# Patient Record
Sex: Female | Born: 1955 | Race: White | Hispanic: No | Smoking: Former smoker
Health system: Southern US, Community
[De-identification: ages and names within clinical notes are randomized; demographics above are authoritative.]

## PROBLEM LIST (undated history)

## (undated) DIAGNOSIS — Z87442 Personal history of urinary calculi: Secondary | ICD-10-CM

## (undated) DIAGNOSIS — M1990 Unspecified osteoarthritis, unspecified site: Secondary | ICD-10-CM

## (undated) DIAGNOSIS — C801 Malignant (primary) neoplasm, unspecified: Secondary | ICD-10-CM

## (undated) DIAGNOSIS — N189 Chronic kidney disease, unspecified: Secondary | ICD-10-CM

## (undated) DIAGNOSIS — T4145XA Adverse effect of unspecified anesthetic, initial encounter: Secondary | ICD-10-CM

## (undated) DIAGNOSIS — Z8719 Personal history of other diseases of the digestive system: Secondary | ICD-10-CM

## (undated) DIAGNOSIS — J189 Pneumonia, unspecified organism: Secondary | ICD-10-CM

## (undated) DIAGNOSIS — S76111A Strain of right quadriceps muscle, fascia and tendon, initial encounter: Secondary | ICD-10-CM

## (undated) DIAGNOSIS — K219 Gastro-esophageal reflux disease without esophagitis: Secondary | ICD-10-CM

## (undated) DIAGNOSIS — R197 Diarrhea, unspecified: Secondary | ICD-10-CM

## (undated) DIAGNOSIS — T8859XA Other complications of anesthesia, initial encounter: Secondary | ICD-10-CM

## (undated) DIAGNOSIS — R112 Nausea with vomiting, unspecified: Secondary | ICD-10-CM

## (undated) DIAGNOSIS — K828 Other specified diseases of gallbladder: Secondary | ICD-10-CM

## (undated) DIAGNOSIS — M4317 Spondylolisthesis, lumbosacral region: Secondary | ICD-10-CM

## (undated) DIAGNOSIS — D134 Benign neoplasm of liver: Secondary | ICD-10-CM

## (undated) DIAGNOSIS — Z9889 Other specified postprocedural states: Secondary | ICD-10-CM

## (undated) HISTORY — DX: Diarrhea, unspecified: R19.7

## (undated) HISTORY — DX: Unspecified osteoarthritis, unspecified site: M19.90

## (undated) HISTORY — PX: ABDOMINAL HYSTERECTOMY: SHX81

## (undated) HISTORY — DX: Gastro-esophageal reflux disease without esophagitis: K21.9

## (undated) HISTORY — DX: Chronic kidney disease, unspecified: N18.9

## (undated) HISTORY — PX: VEIN LIGATION AND STRIPPING: SHX2653

## (undated) HISTORY — PX: TUBAL LIGATION: SHX77

---

## 1998-05-15 ENCOUNTER — Other Ambulatory Visit: Admission: RE | Admit: 1998-05-15 | Discharge: 1998-05-15 | Payer: Self-pay | Admitting: Obstetrics and Gynecology

## 1999-09-01 ENCOUNTER — Other Ambulatory Visit: Admission: RE | Admit: 1999-09-01 | Discharge: 1999-09-01 | Payer: Self-pay | Admitting: Obstetrics & Gynecology

## 1999-10-13 ENCOUNTER — Other Ambulatory Visit: Admission: RE | Admit: 1999-10-13 | Discharge: 1999-10-13 | Payer: Self-pay | Admitting: Anesthesiology

## 1999-11-23 ENCOUNTER — Encounter (INDEPENDENT_AMBULATORY_CARE_PROVIDER_SITE_OTHER): Payer: Self-pay | Admitting: Specialist

## 1999-11-23 ENCOUNTER — Ambulatory Visit (HOSPITAL_COMMUNITY): Admission: RE | Admit: 1999-11-23 | Discharge: 1999-11-23 | Payer: Self-pay | Admitting: General Surgery

## 2001-12-05 ENCOUNTER — Other Ambulatory Visit: Admission: RE | Admit: 2001-12-05 | Discharge: 2001-12-05 | Payer: Self-pay | Admitting: Obstetrics & Gynecology

## 2003-01-03 ENCOUNTER — Other Ambulatory Visit: Admission: RE | Admit: 2003-01-03 | Discharge: 2003-01-03 | Payer: Self-pay | Admitting: Obstetrics & Gynecology

## 2003-06-03 ENCOUNTER — Encounter (INDEPENDENT_AMBULATORY_CARE_PROVIDER_SITE_OTHER): Payer: Self-pay | Admitting: Specialist

## 2003-06-03 ENCOUNTER — Ambulatory Visit (HOSPITAL_COMMUNITY): Admission: RE | Admit: 2003-06-03 | Discharge: 2003-06-03 | Payer: Self-pay | Admitting: Vascular Surgery

## 2004-01-06 ENCOUNTER — Ambulatory Visit (HOSPITAL_COMMUNITY): Admission: RE | Admit: 2004-01-06 | Discharge: 2004-01-06 | Payer: Self-pay | Admitting: Obstetrics and Gynecology

## 2004-01-24 ENCOUNTER — Emergency Department (HOSPITAL_COMMUNITY): Admission: EM | Admit: 2004-01-24 | Discharge: 2004-01-24 | Payer: Self-pay | Admitting: Emergency Medicine

## 2004-02-21 ENCOUNTER — Other Ambulatory Visit: Admission: RE | Admit: 2004-02-21 | Discharge: 2004-02-21 | Payer: Self-pay | Admitting: Obstetrics & Gynecology

## 2005-05-04 ENCOUNTER — Other Ambulatory Visit: Admission: RE | Admit: 2005-05-04 | Discharge: 2005-05-04 | Payer: Self-pay | Admitting: Obstetrics & Gynecology

## 2005-10-05 ENCOUNTER — Ambulatory Visit (HOSPITAL_COMMUNITY): Admission: RE | Admit: 2005-10-05 | Discharge: 2005-10-05 | Payer: Self-pay | Admitting: Obstetrics & Gynecology

## 2006-04-11 ENCOUNTER — Encounter (INDEPENDENT_AMBULATORY_CARE_PROVIDER_SITE_OTHER): Payer: Self-pay | Admitting: *Deleted

## 2006-04-11 ENCOUNTER — Ambulatory Visit (HOSPITAL_COMMUNITY): Admission: RE | Admit: 2006-04-11 | Discharge: 2006-04-12 | Payer: Self-pay | Admitting: Obstetrics & Gynecology

## 2009-04-21 ENCOUNTER — Encounter: Admission: RE | Admit: 2009-04-21 | Discharge: 2009-04-21 | Payer: Self-pay | Admitting: Otolaryngology

## 2009-06-08 ENCOUNTER — Encounter
Admission: RE | Admit: 2009-06-08 | Discharge: 2009-06-08 | Payer: Self-pay | Admitting: Physical Medicine and Rehabilitation

## 2010-10-27 ENCOUNTER — Encounter (INDEPENDENT_AMBULATORY_CARE_PROVIDER_SITE_OTHER): Payer: 59

## 2010-10-27 ENCOUNTER — Ambulatory Visit (INDEPENDENT_AMBULATORY_CARE_PROVIDER_SITE_OTHER): Payer: 59 | Admitting: Vascular Surgery

## 2010-10-27 DIAGNOSIS — I83893 Varicose veins of bilateral lower extremities with other complications: Secondary | ICD-10-CM

## 2010-10-28 NOTE — Consult Note (Signed)
NEW PATIENT CONSULTATION  [name], [name] DOB:  Dec 05, 1955                                       10/27/2010 EAVWU#:[id number]  Patient presents today for evaluation of left leg pain.  She is known to me from a prior ligation stripping of her left great saphenous vein from the level of her knee to her saphenofemoral junction.  She is very active as a physical therapist and reports discomfort over her right calf with an area that she has had developed over her medial calf, tender to touch approximately 1 month ago.  This area is firm, and she reported discomfort with stretching her calf.  This has mostly resolved. She did not notice any erythema of this but was told by a coworker that there was some warmth over this area.  She does not have any history of prior DVT.  She does report some swelling in her left versus her right calf.  She does elevate her legs when possible.  PAST HISTORY:  Negative for major medical difficulties.  No hypertension, cardiac disease.  PAST SURGICAL HISTORY:  Saphenous vein removal and hysterectomy.  SOCIAL HISTORY:  She is single with 3 children.  She quit smoking 25 years ago.  She does not drink alcohol.  She works as a Adult nurse.  REVIEW OF SYSTEMS:  Reported weight gain, up to 165 pounds.  She is 5 feet 5 inches tall. VASCULAR:  Positive for pain with walking. CARDIAC:  Negative. GI:  Positive for hiatal hernia. PULMONARY:  Asthma. URINARY:  Kidney disease with calcium uremic disease. The remainder of her review of systems is entirely normal.  PHYSICAL EXAMINATION:  A well-developed and well-nourished white female appearing her stated age in no acute distress.  Blood pressure is 112/78, pulse 72, respirations 16.  HEENT:  Normal.  She has 2+ radial and 2+ dorsalis pedis pulses bilaterally.  She does have some scattered telangiectasia on her left leg with no evidence of varicosities.  She does have some swelling in her  left calf versus her right.  The area of concern over her medial calf is by physical exam an area of superficial thrombophlebitis in a small plexus of veins at this level.  She underwent noninvasive vascular laboratory studies  in our office, and this reveals no evidence of DVT.  No evidence of deep or superficial reflux.  Her left great saphenous vein is not visualized in her thigh.  I discussed the significance of all this with patient.  I do not see any evidence of serious venous pathology when I imaged the area of concern with the SonoSite Ultrasound.  This clearly is an area of superficial thrombophlebitis, which is resolving.  She was reassured with this discussion.  I explained that this will continue to resolve spontaneously without treatment, and we will see her again on an as- needed basis.    Larina Earthly, M.D. Electronically Signed  TFE/MEDQ  D:  10/27/2010  T:  10/28/2010  Job:  [id number]  cc:   Gerrit Friends. Aldona Bar, M.D.

## 2010-11-10 NOTE — Procedures (Unsigned)
LOWER EXTREMITY VENOUS REFLUX EXAM  INDICATION:  Varicose veins.  EXAM:  Using color-flow imaging and pulse Doppler spectral analysis, the left common femoral, superficial femoral, popliteal, posterior tibial, greater and lesser saphenous veins were evaluated.  There is no evidence suggesting deep venous insufficiency in the left lower extremity.  The left saphenofemoral junction is competent. The left GSV is competent.  The left proximal short saphenous vein demonstrates competency.  GSV Diameter (used if found to be incompetent only)                                           Right    Left Proximal Greater Saphenous Vein           cm       cm Proximal-to-mid-thigh                     cm       cm Mid thigh                                 cm       cm Mid-distal thigh                          cm       cm Distal thigh                              cm       cm Knee                                      cm       cm  IMPRESSION: 1. Left greater saphenous vein is competent. 2. The left greater saphenous vein is not tortuous. 3. The deep venous system is competent. 4. The left small saphenous vein is competent.  ___________________________________________ Larina Earthly, M.D.  OD/MEDQ  D:  10/27/2010  T:  10/27/2010  Job:  [id number]

## 2010-12-04 NOTE — Op Note (Signed)
NAMETESSIE, ORDAZ NO.:  [id number]   MEDICAL RECORD NO.:  [id number]          PATIENT TYPE:  AMB   LOCATION:  SDC                           FACILITY:  [facility]   PHYSICIAN:  Gerrit Friends. Aldona Bar, M.D.   DATE OF BIRTH:  01-21-1956   DATE OF PROCEDURE:  04/11/2006  DATE OF DISCHARGE:                                 OPERATIVE REPORT   PREOPERATIVE DIAGNOSIS:  Dysmenorrhea and menorrhagia, retroverted uterus,  suspected adenomyosis.   POSTOPERATIVE DIAGNOSIS:  Dysmenorrhea and menorrhagia, retroverted uterus,  suspected adenomyosis, pathology pending.   PROCEDURE:  Total vaginal hysterectomy.   SURGEON:  Gerrit Friends. Aldona Bar, M.D.   ASSISTANT:  Luvenia Redden, M.D.   ANESTHESIA:  General endotracheal.   PROCEDURE:  The patient was taken to the operating where, after the  satisfactory induction of general endotracheal anesthesia, was prepped and  draped and placed in the Allen stirrups in the lithotomy position.  As part  of the prep, the bladder was drained of clear urine with a red rubber  catheter in an in-and-out fashion.   Once the patient was adequately draped, the procedure was begun.  A  posterior weighted speculum was placed in the vagina and a double tooth  tenaculum placed on the anterior and posterior lip of the cervix.  There was  good descensus of the cervix to the introitus.  The cervix, at this time,  was circumferentially injected with a dilute solution of normal saline and  Neo-Synephrine and, thereafter, a circumferential incision was made about  the cervix and the cervix dissected off the vagina.  The posterior  peritoneum was identified and entered appropriately.  The posterior surface  of the uterus felt somewhat smooth, the uterus was noted to be at least  upper limits of normal size and was boggy in its field.  At this time, the  uterosacral pedicles were taken bilaterally using curved Heaney clamps and  suture ligature of 0 Vicryl suture.   Additional bites were taken, likewise,  in a similar fashion.  The anterior bladder flap was dissected off and  further parametrial bites were taken with care taken to avoid the bladder  anteriorly.  With further dissection and a fourth bite bilaterally, taken in  a similar fashion using curved Heaney clamps and suture ligatures of 0  Vicryl suture. the bladder flap was definitely identified, entered, and,  thereafter, the uterine artery pedicles were taken bilaterally in a similar  fashion as were additional parametrial pedicles up to the point where the  uterus could be safely inverted.  The ovarian pedicles were clamped, cut and  doubly suture secured.  The specimen was removed and sent to pathology.  The  ovaries appeared normal.  At this time, the posterior peritoneum and  uterosacral was rendered hemostatic with a suture of 0 Vicryl placed in a  locking fashion.  The bladder was again drained of clear urine with a rubber  catheter. At this time, all pedicles were noted to be dry.  A pursestring  suture of 0 Vicryl was used to close  the peritoneum, extraperitonealizing  all vascular pedicles with the exception of the ovarian pedicles which were  cut free after they were noted to be hemostatic.  The vaginal cuff was then  reapproximated in a vertical fashion using interrupted 0 Vicryl suture.  At  this time,  the procedure was felt to be complete and was terminated.  The  cuff was dry.  All counts were correct.  The patient was hemodynamically  stable and was taken to the recovery room in satisfactory addition.   ESTIMATED BLOOD LOSS:  100 mL.   COUNTS:  All counts correct x2.   PATHOLOGIC SPECIMEN:  Consisted of the uterus.  The tubes and ovaries  appeared normal.      Gerrit Friends. Aldona Bar, M.D.  Electronically Signed     RMW/MEDQ  D:  04/11/2006  T:  04/12/2006  Job:  [id number]

## 2010-12-04 NOTE — Op Note (Signed)
NAME:  [name], [name] NO.:  [id number]   MEDICAL RECORD NO.:  [id number]                   PATIENT TYPE:  OIB   LOCATION:  2896                                 FACILITY:  [facility]   PHYSICIAN:  Larina Earthly, M.D.                 DATE OF BIRTH:  14-May-1956   DATE OF PROCEDURE:  06/03/2003  DATE OF DISCHARGE:                                 OPERATIVE REPORT   PREOPERATIVE DIAGNOSIS:  Greater saphenous incompetence and tributary  varicosities, left leg.   POSTOPERATIVE DIAGNOSIS:  Greater saphenous incompetence and tributary  varicosities, left leg.   OPERATION PERFORMED:  Ligation and stripping greater saphenous vein from the  groin to the knee and removal of tributary varicosities at the calf region  with stab avulsion.   SURGEON:  Larina Earthly, M.D.   ASSISTANT:  Jerold Coombe, P.A.   ANESTHESIA:  General endotracheal.   COMPLICATIONS:  None.   DISPOSITION:  Recovery room stable.   DESCRIPTION OF PROCEDURE:  The patient was taken to the operating room and  placed in supine position where the area of the left groin and left leg were  prepped and draped in the usual sterile fashion.  Incision was made over the  level of the saphenofemoral junction and was carried down to isolate the  tributary branches of the saphenofemoral junction and the saphenous vein.  The tributary branches were removed with avulsion and the saphenous vein was  doubly ligated at the junction with the common femoral vein.  The vein was  encircled with a 2-0 silk Potts tie distally and then incision was made  transversely in the saphenous vein and a stripper was passed retrograde down  to the level of the knee.  Small incision was made at the site at the level  of the knee and the saphenous vein was ligated distally at this point and  the stripper was brought out through the saphenous vein at this point.  Next  using multiple stab incisions, the tributary  varicosities were removed with  the stab avulsion technique.  Pressure was held for hemostasis.  The  saphenous vein was then stripped throughout the thigh from the groin to the  knee.  Again pressure was held for hemostasis.  The wounds in the calf were  closed with subcuticular 4-0 Vicryl suture.  Next, the groins were closed  with 3-0 Vicryl in the subcutaneous and subcuticular tissue.  Benzoin and  Steri-Strips were applied.  A Kerlix and Coban pressure dressing were  applied. The patient was extubated and taken to the recovery room in stable  condition.  Larina Earthly, M.D.    TFE/MEDQ  D:  06/03/2003  T:  06/03/2003  Job:  [id number]

## 2010-12-04 NOTE — Discharge Summary (Signed)
NAMENAIOMI, MUSTO NO.:  [id number]   MEDICAL RECORD NO.:  [id number]          PATIENT TYPE:  OIB   LOCATION:  9310                          FACILITY:  [facility]   PHYSICIAN:  Gerrit Friends. Aldona Bar, M.D.   DATE OF BIRTH:  [birth year]   DATE OF ADMISSION:  04/11/2006  DATE OF DISCHARGE:  04/12/2006                                 DISCHARGE SUMMARY   DISCHARGE DIAGNOSES:  1. Adenomyosis of the uterus.  2. Leiomyomatous uterus.  3. Dysmenorrhea.  4. Menorrhagia.   PROCEDURE:  Total vaginal hysterectomy.   SUMMARY:  This 55 year old patient who presented with dysmenorrhea,  menorrhagia, dyspareunia and enlarged posterior uterus was taken to the  operating room after a normal preoperative workup for a total vaginal  hysterectomy which was carried out without incident. Her postoperative  course was unremarkable. Her pathology revealed adenomyosis and leiomyomas.  The uterus weighed slightly over 200 g. During her postoperative course, she  remained afebrile. Vital signs remained stable. On the morning of September  25, her hemoglobin was 10.8 with a white count of 8300 and platelet count of  244,000. On the afternoon of September 25, she was ambulating without  difficulty, tolerating p.o. analgesia without difficulty. Her vital signs  remained stable. She remained afebrile. She was tolerating a regular diet  well. She was having normal bladder function and was passing flatus. She was  discharged to home with all appropriate instructions. She will return to the  office for followup in approximately four weeks' time.   DISCHARGE MEDICATIONS:  1. Motrin 600 mg every 6 hours as needed for cramping or pain.  2. Darvocet-N 100 one every 4 hours as needed for mild pain.  3. Dilaudid 2 mg 1 every 4 hours as needed for more severe pain.   As mentioned, she was given all detailed instructions at the time of  discharge and understood all instructions well.   CONDITION ON DISCHARGE:   Improved.      Gerrit Friends. Aldona Bar, M.D.  Electronically Signed     RMW/MEDQ  D:  04/12/2006  T:  04/14/2006  Job:  [id number]

## 2010-12-04 NOTE — Op Note (Signed)
[facility]  Patient:    [name], [name]                          MRN: [id number] Proc. Date: 11/23/99 Adm. Date:  [id number] Disc. Date: [id number] Attending:  Arlis Porta                           Operative Report  PREOPERATIVE DIAGNOSIS:       Hemorrhoids.  POSTOPERATIVE DIAGNOSES:      Hemorrhoids, anal fissure.  PROCEDURE:                    1. Lateral internal sphincterotomy and                                  fissurectomy.                               2. Hemorrhoidectomy.  SURGEON:                      Adolph Pollack, M.D.  INDICATIONS:  This is a 55 year old female who has been suffering from hemorrhoids for a long time.  She had a thrombosed hemorrhoid excised.  She continues to have problems with her hemorrhoids and also complains of burning pain after a bowel movement and when sitting.  She is unable to be examined in the office because she was severely uncomfortable.  She is now brought to the operating room.  The procedure and the risks including, but not limited to, bleeding, infection, anal stenosis, and anal incontinence, were explained to her.  DESCRIPTION OF PROCEDURE:  She was placed supine on the operating table and general anesthetic was administered.  She was then placed in the lithotomy position.  The perianal area was sterilely prepped and draped.  Examination under anesthesia was performed.  There was a left lateral hemorrhoid present and a right anterior lateral hemorrhoid present.  There was no evidence of right posterolateral hemorrhoidal disease.  There was a posterior anal fissure present.  I subsequently performed a closed lateral internal sphincterotomy by making an incision in the 3 oclock position and tenting up the internal sphincter muscle and after incising that, I dilated the anus.  I then performed fissurectomy using the cautery and sent it to pathology.  I approached the left-sided hemorrhoid.  I  marked the excision area with the cautery, then injected 0.5% Marcaine with epinephrine and excised it sharply. Bleeding points were controlled with the cautery.  I closed the mucosa with running locking 3-0 chromic suture.  Next, I approached the right anterior lateral hemorrhoid and marked its area of excision with the cautery.  I then injected local anesthetic and excised it sharply.  I then closed the mucosa with a running locking 3-0 chromic suture.  I noticed some bleeding internally at the rectal area, and I just ligated this with a 3-0 chromic suture.  I then inspected the area and noted no further bleeding.  An anal block with 0.5% Marcaine with epinephrine was performed.  I then placed a piece of Gelfoam in the anal canal and covered it with a bulky dressing.  She tolerated the procedure well without any apparent complications and was taken to the recovery room in satisfactory  condition.DD:  11/25/99 TD:  11/25/99 Job: [id number]

## 2010-12-04 NOTE — H&P (Signed)
[name], [name] NO.:  [id number]   MEDICAL RECORD NO.:  [id number]          PATIENT TYPE:  AMB   LOCATION:  SDC                           FACILITY:  [facility]   PHYSICIAN:  Gerrit Friends. Aldona Bar, M.D.   DATE OF BIRTH:  [birth year]   DATE OF ADMISSION:  04/11/2006  DATE OF DISCHARGE:                                HISTORY & PHYSICAL   OFFICE NUMBER:  [id number].   This patient is being admitted on Monday, September 24.  She is a 7:30 case.  Please have this dictation in the day surgery area.   HISTORY:  [name] is a 55 year old gravida 3, para 3, married female being taken  to the operating room for a total vaginal hysterectomy with a preoperative  diagnosis of back pain, likely adenomyosis, dysmenorrhea, and dyspareunia.   This patient has had a known retroflexed uterus for years and in the past 6  months, there have been increasing symptoms of lower back discomfort,  discomfort with intercourse, cramping with her periods, more irregular  bleeding, and increased pelvic pressure in general.  Her evaluation has  included an ultrasound, which was done in March 2007, which revealed the  retroflexed uterus with an overall globular appearance and some ill  definition of the zonal anatomy consistent with probable adenomyosis.  Both  ovaries appeared normal.  The patient's cytology has always been normal and  her mammograms have always been normal.  More recently there have been some  problems with irregular bleeding and dysmenorrhea, and this was controlled  most recently with some oral contraceptives.   Because of the increased pelvic pressure and increased discomfort related to  the retroverted uterus, she is now being taken to the operating room for a  total vaginal hysterectomy.   She does have a history of kidney stones.  Her most recent visit to the  urologist was in June 2007.   She did have a tubal sterilization procedure, which was carried out  postpartum after the  birth of her last child.  She has also had a saphenous  vein in her left leg removed by Dr. Arbie Cookey about 5 years ago.   She does not relate any significant bladder issues such as stress  incontinence, although she says she does have some problem occasionally  holding her urine, especially with continued strenuous exercise.   After multiple discussions with the patient, it was decided to proceed with  a total vaginal hysterectomy as the simplest way of alleviating her symptoms  of pelvic pressure, low back pain, dysmenorrhea, dyspareunia and irregular  bleeding.   PAST MEDICAL HISTORY:  Allergies:  The patient is sensitive to PENICILLIN,  ERYTHROMYCIN and MORPHINE.   Medications at this time include hydrochlorothiazide 12.5-25 mg in the  morning (prescribed by Dr. Aldean Ast).  She is also on some Advair 150/50  mcg at bedtime.  She will bring this inhaler to the hospital with her.   Operative procedures:  As above.   FAMILY HISTORY:  Noncontributory.   SOCIAL HISTORY:  Noncontributory.   REVIEW OF SYSTEMS:  Negative  with the exception of above.   PHYSICAL EXAMINATION:  GENERAL:  Examination at the time of admission finds  a well-developed female.  VITAL SIGNS:  Weight 159.  Blood pressure 128/70.  Height 5 feet 5 inches.  Temperature 98.2.  Pulse 80 and regular.  Respirations 18 and regular.  HEENT:  Negative.  Thyroid not enlarged.  CHEST:  Clear to auscultation and percussion.  CARDIOVASCULAR:  Regular rate and rhythm, no murmur.  BREASTS:  Negative.  ABDOMEN:  Unremarkable.  PELVIC:  External genitalia, BUS normal.  Introitus bidigital, vault fairly  well-supported.  Cervix:  No gross lesions.  The cervix does pull down to  the introitus.  The uterus is very posterior and tender with manipulation.  Adnexal area is unremarkable.  Rectovaginal exam confirmatory.  EXTREMITIES:  Negative.  NEUROLOGIC:  Physiologic.   IMPRESSION:  1. Dyspareunia.  2. Dysmenorrhea.  3. Low  back pain.  4. Suspected adenomyosis.  5. Retroverted uterus.   PLAN:  Total vaginal hysterectomy.      Gerrit Friends. Aldona Bar, M.D.  Electronically Signed     RMW/MEDQ  D:  04/07/2006  T:  04/07/2006  Job:  [id number]

## 2011-06-17 ENCOUNTER — Other Ambulatory Visit: Payer: Self-pay | Admitting: Gastroenterology

## 2011-06-17 DIAGNOSIS — R1013 Epigastric pain: Secondary | ICD-10-CM

## 2011-06-18 ENCOUNTER — Ambulatory Visit
Admission: RE | Admit: 2011-06-18 | Discharge: 2011-06-18 | Disposition: A | Discharge status: Home / Self Care | Payer: 59 | Source: Ambulatory Visit | Attending: Gastroenterology | Admitting: Gastroenterology

## 2011-06-18 DIAGNOSIS — R1013 Epigastric pain: Secondary | ICD-10-CM

## 2011-07-23 ENCOUNTER — Other Ambulatory Visit: Payer: Self-pay | Admitting: Gastroenterology

## 2011-07-23 ENCOUNTER — Other Ambulatory Visit (HOSPITAL_COMMUNITY): Payer: Self-pay | Admitting: Gastroenterology

## 2011-07-30 ENCOUNTER — Ambulatory Visit
Admission: RE | Admit: 2011-07-30 | Discharge: 2011-07-30 | Disposition: A | Discharge status: Home / Self Care | Payer: 59 | Source: Ambulatory Visit | Attending: Gastroenterology | Admitting: Gastroenterology

## 2011-07-30 MED ORDER — IOHEXOL 300 MG/ML  SOLN
100.0000 mL | Freq: Once | INTRAMUSCULAR | Status: AC | PRN
  Administered 2011-07-30: 100 mL via INTRAVENOUS

## 2011-08-13 ENCOUNTER — Inpatient Hospital Stay (HOSPITAL_COMMUNITY)
Admission: RE | Admit: 2011-08-13 | Discharge: 2011-08-13 | Payer: 59 | Source: Ambulatory Visit | Attending: Gastroenterology | Admitting: Gastroenterology

## 2011-08-19 ENCOUNTER — Encounter (HOSPITAL_COMMUNITY)
Admission: RE | Admit: 2011-08-19 | Discharge: 2011-08-19 | Disposition: A | Discharge status: Home / Self Care | Payer: 59 | Source: Ambulatory Visit | Attending: Gastroenterology | Admitting: Gastroenterology

## 2011-08-19 DIAGNOSIS — R109 Unspecified abdominal pain: Secondary | ICD-10-CM | POA: Insufficient documentation

## 2011-08-19 MED ORDER — TECHNETIUM TC 99M MEBROFENIN IV KIT
5.5000 | PACK | Freq: Once | INTRAVENOUS | Status: AC | PRN
  Administered 2011-08-19: 6 via INTRAVENOUS

## 2011-09-08 ENCOUNTER — Ambulatory Visit (INDEPENDENT_AMBULATORY_CARE_PROVIDER_SITE_OTHER): Payer: 59 | Admitting: General Surgery

## 2011-09-08 ENCOUNTER — Encounter (INDEPENDENT_AMBULATORY_CARE_PROVIDER_SITE_OTHER): Payer: Self-pay | Admitting: General Surgery

## 2011-09-08 VITALS — BP 126/82 | HR 70 | Temp 98.0°F | Ht 65.0 in | Wt 177.2 lb

## 2011-09-08 DIAGNOSIS — N200 Calculus of kidney: Secondary | ICD-10-CM

## 2011-09-08 DIAGNOSIS — K828 Other specified diseases of gallbladder: Secondary | ICD-10-CM | POA: Insufficient documentation

## 2011-09-08 NOTE — Progress Notes (Signed)
Patient ID: [name], female   DOB: [birth year], 56 y.o.   MRN: [id number]  Chief Complaint  Patient presents with  . Pre-op Exam    eval abn hida scan    HPI [name] is a 56 y.o. female.   HPI  She is referred by Dr. Bosie Clos for further evaluation and treatment of epigastric abdominal pain and gallbladder dysfunction-EF of 9%.  She developed postprandial pressure type epigastric pain about 6-8 weeks ago.  She occasionally has nausea with this.  She took an antacid and had some relief but is still having the pain now.  She does have alternating diarrhea and constipation at times.  An abdominal US demonstrated no gallstones or wall thickening.  A CT scan demonstrated kidney stones.  A nuclear medicine hepatobiliary scan demonstrated a gallbladder ejection fraction of 9 %.    Also, Dr. Annabell Howells had referred her to Dr. Gerrit Friends for evaluation of possible hyperparathyroidism.  She has multiple kidney stones and he was concerned that hyperPTH may be the etiology.  She has asked me to evaluate this as well.  Past Medical History  Diagnosis Date  . Arthritis   . Asthma   . GERD (gastroesophageal reflux disease)   . Chronic kidney disease   . Trouble swallowing   . Abdominal distension   . Abdominal pain   . Constipation   . Diarrhea   . Arthritis pain     History reviewed. No pertinent past surgical history.  Family History  Problem Relation Age of Onset  . Cancer Mother     colon  . Heart disease Father   . Cancer Sister     lung/squamous cell    Social History History  Substance Use Topics  . Smoking status: Former Games developer  . Smokeless tobacco: Former Neurosurgeon    Quit date: 09/07/1981  . Alcohol Use: 0.0 oz/week    1-2 Glasses of wine per week     wek    Allergies  Allergen Reactions  . Penicillins Anaphylaxis  . Morphine And Related     Low b/p.[name]...hallucinaions  . Erythromycin Hives and Rash    Current Outpatient Prescriptions  Medication Sig Dispense Refill    . Fluticasone-Salmeterol (ADVAIR) 100-50 MCG/DOSE AEPB Inhale 1 puff into the lungs every 12 (twelve) hours.      Marland Kitchen HYDROcodone-acetaminophen (NORCO) 5-325 MG per tablet       . pantoprazole (PROTONIX) 40 MG tablet         Review of Systems Review of Systems  Constitutional: Negative for fever and chills.  HENT: Positive for trouble swallowing.   Respiratory: Negative.   Cardiovascular: Negative.   Gastrointestinal: Positive for nausea, abdominal pain, diarrhea, constipation and abdominal distention.  Genitourinary: Negative for dysuria, hematuria and vaginal bleeding.  Musculoskeletal: Positive for arthralgias.  Neurological: Negative for syncope, light-headedness and headaches.  Hematological: Does not bruise/bleed easily.    Blood pressure 126/82, pulse 70, temperature 98 F (36.7 C), temperature source Temporal, height 5\' 5"  (1.651 m), weight 177 lb 3.2 oz (80.377 kg), last menstrual period 08/19/2011, SpO2 97.00%.  Physical Exam Physical Exam  Constitutional:       Overweight female in NAD  HENT:  Head: Normocephalic and atraumatic.  Eyes: Conjunctivae and EOM are normal.  Cardiovascular: Normal rate and regular rhythm.   No murmur heard. Pulmonary/Chest: She is in respiratory distress.  Abdominal: Soft. She exhibits no distension. There is no tenderness. There is no guarding.       Small  subumbilical scar.  Musculoskeletal: She exhibits no edema.  Neurological: She is alert.  Skin: Skin is warm and dry.  Psychiatric: She has a normal mood and affect. Her behavior is normal.    Data Reviewed Dr. Bosie Clos note.  [place] and HIDA reports.  Dr. Belva Crome notes which demonstrate a serium calcium level of 9.5 and a iPTH  Level of 57.9-both within normal limits.  Assessment    1.  Epigastric pain and biliary dyskinesia-she is interested in cholecystectomy for this.  2.  Kidney stones-doubt hyperparathyroidism.  I discussed this with Dr. Gerrit Friends and he felt that there was a low  likelihood for hyperparathyroidism, however a couple of other tests could be run.    Plan    24 hour urine for calcium.  25-hydroxy-vitamin D level.  Laparoscopic cholecystectomy with IOC.  I have explained the procedure, risks, and aftercare of cholecystectomy.  Risks include but are not limited to bleeding, infection, wound problems, anesthesia, diarrhea, bile leak, injury to common bile duct/liver/intestine.  Shee seems to understand and agrees to proceed.  With discuss the results of the above tests with her when the results are known.        [name] J 09/08/2011, 6:26 PM

## 2011-09-08 NOTE — Patient Instructions (Signed)
Strict lowfat diet. 

## 2011-09-20 ENCOUNTER — Encounter (INDEPENDENT_AMBULATORY_CARE_PROVIDER_SITE_OTHER): Payer: 59 | Admitting: Surgery

## 2011-09-20 ENCOUNTER — Other Ambulatory Visit: Payer: Self-pay | Admitting: Orthopedic Surgery

## 2011-09-20 DIAGNOSIS — M4800 Spinal stenosis, site unspecified: Secondary | ICD-10-CM

## 2011-09-20 DIAGNOSIS — M5126 Other intervertebral disc displacement, lumbar region: Secondary | ICD-10-CM

## 2011-09-23 ENCOUNTER — Ambulatory Visit (INDEPENDENT_AMBULATORY_CARE_PROVIDER_SITE_OTHER): Payer: 59 | Admitting: General Surgery

## 2011-09-24 ENCOUNTER — Ambulatory Visit
Admission: RE | Admit: 2011-09-24 | Discharge: 2011-09-24 | Disposition: A | Discharge status: Home / Self Care | Payer: 59 | Source: Ambulatory Visit | Attending: Orthopedic Surgery | Admitting: Orthopedic Surgery

## 2011-09-24 DIAGNOSIS — M5126 Other intervertebral disc displacement, lumbar region: Secondary | ICD-10-CM

## 2011-09-24 DIAGNOSIS — M4800 Spinal stenosis, site unspecified: Secondary | ICD-10-CM

## 2011-09-24 MED ORDER — IOHEXOL 180 MG/ML  SOLN
20.0000 mL | Freq: Once | INTRAMUSCULAR | Status: AC | PRN
  Administered 2011-09-24: 20 mL via INTRATHECAL

## 2011-09-24 MED ORDER — DIAZEPAM 5 MG PO TABS
10.0000 mg | ORAL_TABLET | Freq: Once | ORAL | Status: AC
  Administered 2011-09-24: 10 mg via ORAL

## 2011-09-24 NOTE — Progress Notes (Signed)
1425 Pt c/o some tingling in fingers and some tingling in chest. Skin warm and dry, bp 117/74 with heart rate 72. Pt up to get dressed and felt better after she got up. Stated she was really hungry and had a big gas bubble.

## 2011-09-26 ENCOUNTER — Telehealth (HOSPITAL_COMMUNITY): Payer: Self-pay | Admitting: Diagnostic Radiology

## 2011-09-26 NOTE — Telephone Encounter (Signed)
Pt. Called.  She has positional headache after myelogram.  She probably has a csf leak headache.  She does not have fever.  We spoke for about 10 minutes.  She is going to continue to lie flat till tomorrow morning.  i gave her the phone number of the nurse at [address] and told her to call that number in the morning if her headache persists.  She may need a blood patch.

## 2011-09-27 ENCOUNTER — Other Ambulatory Visit: Payer: Self-pay | Admitting: Orthopedic Surgery

## 2011-09-27 ENCOUNTER — Encounter (HOSPITAL_COMMUNITY): Payer: Self-pay | Admitting: Pharmacy Technician

## 2011-09-27 ENCOUNTER — Telehealth: Payer: Self-pay | Admitting: Radiology

## 2011-09-27 ENCOUNTER — Inpatient Hospital Stay
Admission: RE | Admit: 2011-09-27 | Discharge: 2011-09-27 | Payer: 59 | Source: Ambulatory Visit | Attending: Orthopedic Surgery | Admitting: Orthopedic Surgery

## 2011-09-27 DIAGNOSIS — G971 Other reaction to spinal and lumbar puncture: Secondary | ICD-10-CM

## 2011-09-27 NOTE — Pre-Procedure Instructions (Signed)
[address] KETSIA [address]BAUGH  09/27/2011   Your procedure is scheduled on:  10/08/2011 @12 :10PM  Report to [facility] at 5:30 AM.  Call this number if you have problems the morning of surgery: [phone]   Remember:   Do not eat food:After Midnight.  May have clear liquids: up to 4 Hours before arrival(nothing after 1:30AM).  Clear liquids include soda, tea, black coffee, apple or grape juice, broth.  Take these medicines the morning of surgery with A SIP OF WATER: Protonix, Gabapentin, Percocet   Do not wear jewelry, make-up or nail polish.  Do not wear lotions, powders, or perfumes. You may wear deodorant.  Do not shave 48 hours prior to surgery.  Do not bring valuables to the hospital.  Contacts, dentures or bridgework may not be worn into surgery.  Leave suitcase in the car. After surgery it may be brought to your room.  For patients admitted to the hospital, checkout time is 11:00 AM the day of discharge.   Patients discharged the day of surgery will not be allowed to drive home.  Name and phone number of your driver:   Special Instructions: CHG Shower Use Special Wash: 1/2 bottle night before surgery and 1/2 bottle morning of surgery.   Please read over the following fact sheets that you were given: Pain Booklet, Coughing and Deep Breathing and Surgical Site Infection Prevention

## 2011-09-27 NOTE — Telephone Encounter (Signed)
Pt had a myelo on 09/24/11, started with h/a on sat. Called our dr. at [facility] and was told to resume bedrest which see did. Woke up at 4am this morning with severe h/a and vomited again.  Dr. Jeannetta Ellis office called and asked for blood patch order.  Message sent up to the dr.'s clinic at 8:04 am today.dd

## 2011-09-28 ENCOUNTER — Encounter (HOSPITAL_COMMUNITY): Payer: Self-pay

## 2011-09-28 ENCOUNTER — Encounter (HOSPITAL_COMMUNITY)
Admission: RE | Admit: 2011-09-28 | Discharge: 2011-09-28 | Disposition: A | Discharge status: Home / Self Care | Payer: 59 | Source: Ambulatory Visit | Attending: General Surgery | Admitting: General Surgery

## 2011-09-28 HISTORY — DX: Spondylolisthesis, lumbosacral region: M43.17

## 2011-09-28 HISTORY — DX: Other specified diseases of gallbladder: K82.8

## 2011-09-28 HISTORY — DX: Personal history of other diseases of the digestive system: Z87.19

## 2011-09-28 LAB — CBC
MCV: 92.3 fL (ref 78.0–100.0)
Platelets: 288 10*3/uL (ref 150–400)
RDW: 12.8 % (ref 11.5–15.5)
WBC: 7.7 10*3/uL (ref 4.0–10.5)

## 2011-09-28 LAB — COMPREHENSIVE METABOLIC PANEL
AST: 20 U/L (ref 0–37)
Albumin: 4 g/dL (ref 3.5–5.2)
Calcium: 9.9 mg/dL (ref 8.4–10.5)
Chloride: 104 mEq/L (ref 96–112)
Creatinine, Ser: 0.72 mg/dL (ref 0.50–1.10)
Sodium: 140 mEq/L (ref 135–145)
Total Bilirubin: 0.4 mg/dL (ref 0.3–1.2)

## 2011-09-28 LAB — SURGICAL PCR SCREEN: MRSA, PCR: NEGATIVE

## 2011-09-28 LAB — DIFFERENTIAL
Lymphocytes Relative: 29 % (ref 12–46)
Monocytes Absolute: 0.6 10*3/uL (ref 0.1–1.0)
Monocytes Relative: 8 % (ref 3–12)
Neutro Abs: 4.7 10*3/uL (ref 1.7–7.7)

## 2011-09-29 ENCOUNTER — Telehealth: Payer: Self-pay | Admitting: Radiology

## 2011-09-29 NOTE — Telephone Encounter (Signed)
Pt says she is back to work and headache is gone. Low back is sore, maybe more so than before myleo. Says she felt as bad as she has ever felt for those several days.

## 2011-10-01 ENCOUNTER — Other Ambulatory Visit (HOSPITAL_COMMUNITY): Payer: 59

## 2011-10-07 MED ORDER — CIPROFLOXACIN IN D5W 400 MG/200ML IV SOLN
400.0000 mg | INTRAVENOUS | Status: AC
  Administered 2011-10-08: 400 mg via INTRAVENOUS
  Filled 2011-10-07: qty 200

## 2011-10-08 ENCOUNTER — Ambulatory Visit (HOSPITAL_COMMUNITY)
Admission: RE | Admit: 2011-10-08 | Discharge: 2011-10-08 | Disposition: A | Discharge status: Home / Self Care | Payer: 59 | Source: Ambulatory Visit | Attending: General Surgery | Admitting: General Surgery

## 2011-10-08 ENCOUNTER — Ambulatory Visit (HOSPITAL_COMMUNITY): Payer: 59

## 2011-10-08 ENCOUNTER — Encounter (HOSPITAL_COMMUNITY)
Admission: RE | Disposition: A | Discharge status: Home / Self Care | Payer: Self-pay | Source: Ambulatory Visit | Attending: General Surgery

## 2011-10-08 ENCOUNTER — Encounter (HOSPITAL_COMMUNITY): Payer: Self-pay | Admitting: Certified Registered Nurse Anesthetist

## 2011-10-08 ENCOUNTER — Ambulatory Visit (HOSPITAL_COMMUNITY): Payer: 59 | Admitting: Certified Registered Nurse Anesthetist

## 2011-10-08 DIAGNOSIS — K8010 Calculus of gallbladder with chronic cholecystitis without obstruction: Secondary | ICD-10-CM

## 2011-10-08 DIAGNOSIS — Z01812 Encounter for preprocedural laboratory examination: Secondary | ICD-10-CM | POA: Insufficient documentation

## 2011-10-08 DIAGNOSIS — K219 Gastro-esophageal reflux disease without esophagitis: Secondary | ICD-10-CM | POA: Insufficient documentation

## 2011-10-08 DIAGNOSIS — J45909 Unspecified asthma, uncomplicated: Secondary | ICD-10-CM | POA: Insufficient documentation

## 2011-10-08 HISTORY — PX: CHOLECYSTECTOMY: SHX55

## 2011-10-08 SURGERY — LAPAROSCOPIC CHOLECYSTECTOMY WITH INTRAOPERATIVE CHOLANGIOGRAM
Anesthesia: General | Site: Abdomen | Wound class: Contaminated

## 2011-10-08 MED ORDER — LACTATED RINGERS IV SOLN
INTRAVENOUS | Status: DC | PRN
  Administered 2011-10-08 (×2): via INTRAVENOUS

## 2011-10-08 MED ORDER — SCOPOLAMINE 1 MG/3DAYS TD PT72
MEDICATED_PATCH | TRANSDERMAL | Status: DC | PRN
  Administered 2011-10-08: 1 via TRANSDERMAL

## 2011-10-08 MED ORDER — BUPIVACAINE-EPINEPHRINE 0.25% -1:200000 IJ SOLN
INTRAMUSCULAR | Status: DC | PRN
  Administered 2011-10-08: 16.5 mL

## 2011-10-08 MED ORDER — HYDROMORPHONE HCL PF 1 MG/ML IJ SOLN
INTRAMUSCULAR | Status: AC
  Filled 2011-10-08: qty 1

## 2011-10-08 MED ORDER — FENTANYL CITRATE 0.05 MG/ML IJ SOLN
INTRAMUSCULAR | Status: DC | PRN
  Administered 2011-10-08 (×2): 100 ug via INTRAVENOUS
  Administered 2011-10-08: 50 ug via INTRAVENOUS

## 2011-10-08 MED ORDER — ROCURONIUM BROMIDE 100 MG/10ML IV SOLN
INTRAVENOUS | Status: DC | PRN
  Administered 2011-10-08: 5 mg via INTRAVENOUS
  Administered 2011-10-08: 35 mg via INTRAVENOUS

## 2011-10-08 MED ORDER — OXYCODONE-ACETAMINOPHEN 5-325 MG PO TABS: 1.0000 | ORAL_TABLET | ORAL | Status: AC | PRN

## 2011-10-08 MED ORDER — 0.9 % SODIUM CHLORIDE (POUR BTL) OPTIME
TOPICAL | Status: DC | PRN
  Administered 2011-10-08: 1000 mL

## 2011-10-08 MED ORDER — FENTANYL CITRATE 0.05 MG/ML IJ SOLN
INTRAMUSCULAR | Status: AC
  Filled 2011-10-08: qty 2

## 2011-10-08 MED ORDER — ONDANSETRON HCL 4 MG/2ML IJ SOLN
INTRAMUSCULAR | Status: DC | PRN
  Administered 2011-10-08 (×2): 4 mg via INTRAVENOUS

## 2011-10-08 MED ORDER — PROPOFOL 10 MG/ML IV EMUL
INTRAVENOUS | Status: DC | PRN
  Administered 2011-10-08: 200 mg via INTRAVENOUS

## 2011-10-08 MED ORDER — ONDANSETRON HCL 4 MG/2ML IJ SOLN: 4.0000 mg | Freq: Four times a day (QID) | INTRAMUSCULAR | Status: DC | PRN

## 2011-10-08 MED ORDER — GLYCOPYRROLATE 0.2 MG/ML IJ SOLN
INTRAMUSCULAR | Status: DC | PRN
  Administered 2011-10-08: 0.4 mg via INTRAVENOUS

## 2011-10-08 MED ORDER — ACETAMINOPHEN 650 MG RE SUPP: 650.0000 mg | RECTAL | Status: DC | PRN

## 2011-10-08 MED ORDER — SODIUM CHLORIDE 0.9 % IR SOLN
Status: DC | PRN
  Administered 2011-10-08: 1000 mL

## 2011-10-08 MED ORDER — FENTANYL CITRATE 0.05 MG/ML IJ SOLN
25.0000 ug | INTRAMUSCULAR | Status: DC | PRN
  Administered 2011-10-08 (×2): 25 ug via INTRAVENOUS

## 2011-10-08 MED ORDER — ACETAMINOPHEN 325 MG PO TABS: 650.0000 mg | ORAL_TABLET | ORAL | Status: DC | PRN

## 2011-10-08 MED ORDER — HYDROMORPHONE HCL PF 1 MG/ML IJ SOLN
0.2500 mg | INTRAMUSCULAR | Status: DC | PRN
  Administered 2011-10-08 (×4): 0.5 mg via INTRAVENOUS

## 2011-10-08 MED ORDER — MIDAZOLAM HCL 5 MG/5ML IJ SOLN
INTRAMUSCULAR | Status: DC | PRN
  Administered 2011-10-08: 2 mg via INTRAVENOUS

## 2011-10-08 MED ORDER — DEXAMETHASONE SODIUM PHOSPHATE 4 MG/ML IJ SOLN
INTRAMUSCULAR | Status: DC | PRN
  Administered 2011-10-08: 4 mg via INTRAVENOUS

## 2011-10-08 MED ORDER — OXYCODONE HCL 5 MG PO TABS
ORAL_TABLET | ORAL | Status: AC
  Administered 2011-10-08: 5 mg
  Filled 2011-10-08: qty 2

## 2011-10-08 MED ORDER — LIDOCAINE HCL (CARDIAC) 20 MG/ML IV SOLN
INTRAVENOUS | Status: DC | PRN
  Administered 2011-10-08: 20 mg via INTRAVENOUS

## 2011-10-08 MED ORDER — OXYCODONE HCL 5 MG PO TABS: 5.0000 mg | ORAL_TABLET | ORAL | Status: DC | PRN

## 2011-10-08 MED ORDER — NEOSTIGMINE METHYLSULFATE 1 MG/ML IJ SOLN
INTRAMUSCULAR | Status: DC | PRN
  Administered 2011-10-08: 3 mg via INTRAVENOUS

## 2011-10-08 MED ORDER — SCOPOLAMINE 1 MG/3DAYS TD PT72
1.0000 | MEDICATED_PATCH | TRANSDERMAL | Status: DC
  Filled 2011-10-08: qty 1

## 2011-10-08 MED ORDER — IOHEXOL 300 MG/ML  SOLN
INTRAMUSCULAR | Status: DC | PRN
  Administered 2011-10-08: 4 mL via INTRAVENOUS

## 2011-10-08 MED ORDER — SODIUM CHLORIDE 0.9 % IJ SOLN: 3.0000 mL | INTRAMUSCULAR | Status: DC | PRN

## 2011-10-08 SURGICAL SUPPLY — 44 items
APPLIER CLIP 5 13 M/L LIGAMAX5 (MISCELLANEOUS) ×2
APR CLP MED LRG 5 ANG JAW (MISCELLANEOUS) ×1
BAG SPEC RTRVL LRG 6X4 10 (ENDOMECHANICALS) ×1
BENZOIN TINCTURE PRP APPL 2/3 (GAUZE/BANDAGES/DRESSINGS) ×2 IMPLANT
CANISTER SUCTION 2500CC (MISCELLANEOUS) ×2 IMPLANT
CHLORAPREP W/TINT 26ML (MISCELLANEOUS) ×2 IMPLANT
CLIP APPLIE 5 13 M/L LIGAMAX5 (MISCELLANEOUS) ×1 IMPLANT
CLOTH BEACON ORANGE TIMEOUT ST (SAFETY) ×2 IMPLANT
COVER MAYO STAND STRL (DRAPES) ×2 IMPLANT
COVER SURGICAL LIGHT HANDLE (MISCELLANEOUS) ×2 IMPLANT
DECANTER SPIKE VIAL GLASS SM (MISCELLANEOUS) ×4 IMPLANT
DRAPE C-ARM 42X72 X-RAY (DRAPES) ×2 IMPLANT
DRAPE UTILITY 15X26 W/TAPE STR (DRAPE) ×4 IMPLANT
ELECT REM PT RETURN 9FT ADLT (ELECTROSURGICAL) ×2
ELECTRODE REM PT RTRN 9FT ADLT (ELECTROSURGICAL) ×1 IMPLANT
GAUZE SPONGE 2X2 8PLY STRL LF (GAUZE/BANDAGES/DRESSINGS) ×1 IMPLANT
GLOVE BIO SURGEON STRL SZ7.5 (GLOVE) IMPLANT
GLOVE BIOGEL PI IND STRL 7.0 (GLOVE) ×2 IMPLANT
GLOVE BIOGEL PI IND STRL 8 (GLOVE) ×1 IMPLANT
GLOVE BIOGEL PI INDICATOR 7.0 (GLOVE) ×2
GLOVE BIOGEL PI INDICATOR 8 (GLOVE) ×1
GLOVE SURG SS PI 6.5 STRL IVOR (GLOVE) ×4 IMPLANT
GLOVE SURG SS PI 7.5 STRL IVOR (GLOVE) ×4 IMPLANT
GLOVE SURG SS PI 8.0 STRL IVOR (GLOVE) ×2 IMPLANT
GOWN STRL NON-REIN LRG LVL3 (GOWN DISPOSABLE) ×8 IMPLANT
GOWN STRL REIN XL XLG (GOWN DISPOSABLE) ×2 IMPLANT
KIT BASIN OR (CUSTOM PROCEDURE TRAY) ×2 IMPLANT
KIT ROOM TURNOVER OR (KITS) ×2 IMPLANT
NS IRRIG 1000ML POUR BTL (IV SOLUTION) ×2 IMPLANT
PAD ARMBOARD 7.5X6 YLW CONV (MISCELLANEOUS) ×2 IMPLANT
POUCH SPECIMEN RETRIEVAL 10MM (ENDOMECHANICALS) ×2 IMPLANT
SCISSORS LAP 5X35 DISP (ENDOMECHANICALS) IMPLANT
SET CHOLANGIOGRAPH 5 50 .035 (SET/KITS/TRAYS/PACK) ×2 IMPLANT
SET IRRIG TUBING LAPAROSCOPIC (IRRIGATION / IRRIGATOR) ×2 IMPLANT
SLEEVE ENDOPATH XCEL 5M (ENDOMECHANICALS) ×4 IMPLANT
SPECIMEN JAR SMALL (MISCELLANEOUS) ×2 IMPLANT
SPONGE GAUZE 2X2 STER 10/PKG (GAUZE/BANDAGES/DRESSINGS) ×1
SUT MON AB 4-0 PC3 18 (SUTURE) ×4 IMPLANT
TOWEL OR 17X24 6PK STRL BLUE (TOWEL DISPOSABLE) ×2 IMPLANT
TOWEL OR 17X26 10 PK STRL BLUE (TOWEL DISPOSABLE) ×2 IMPLANT
TRAY LAPAROSCOPIC (CUSTOM PROCEDURE TRAY) ×2 IMPLANT
TROCAR XCEL BLUNT TIP 100MML (ENDOMECHANICALS) ×2 IMPLANT
TROCAR XCEL NON-BLD 11X100MML (ENDOMECHANICALS) IMPLANT
TROCAR XCEL NON-BLD 5MMX100MML (ENDOMECHANICALS) ×2 IMPLANT

## 2011-10-08 NOTE — Progress Notes (Signed)
Pt has iv at [facility]   Lr....  Taking po fluids but feels slightly neauseous upon arrival ...  At this time is sleeping.    No vomiting .Marland KitchenMarland Kitchen

## 2011-10-08 NOTE — Preoperative (Signed)
Beta Blockers   Reason not to administer Beta Blockers:Not Applicable 

## 2011-10-08 NOTE — Op Note (Signed)
Preoperative diagnosis:  Biliary dyskinesia  Postoperative diagnosis:  Same  Procedure: Laparoscopic cholecystectomy with cholangiogram.  Surgeon: Avel Peace, M.D.  Asst.:  Ovidio Kin, M.D.  Anesthesia: Gen.  Indication:   This is a 56 year old female with biliary colic type symptoms who was found to have biliary dyskinesia.  She present for elective laparoscopic cholecystectomy.  The procedure, risks, and aftercare have been explained to her.  Technique: The patient was brought to the operating room, placed supine on the operating table, and a general anesthetic was administered.  The abdominal wall was then sterilely prepped and draped. Local anesthetic (Marcaine) was infiltrated in the subumbilical region. A small subumbilical incision was made through the skin, subcutaneous tissue, fascia, and peritoneum entering the peritoneal cavity under direct vision. A pursestring suture of 0 Vicryl was placed around the edges of the fascia. A Hassan trocar was introduced into the peritoneal cavity and a pneumoperitoneum was created by insufflation of carbon dioxide gas. The laparoscope was introduced into the trocar and no underlying bleeding or organ injury was noted. The patient was then placed in the reverse Trendelenburg position with the right side tilted slightly up.  Three more trochars were then placed into the abdominal cavity under laparoscopic vision. One in the epigastric area, and 2 in the right upper quadrant area. The gallbladder was visualized and the fundus was grasped and retracted toward the right shoulder.  No acute inflammatory changes or significant adhesions were noted.  The infundibulum was mobilized with dissection close to the gallbladder and retracted laterally. The cystic duct was identified and a window was created around it. The cystic artery was also identified and a window was created around it.  It was clipped and divided.  The critical view was achieved. A clip was  placed at the neck of the gallbladder. A small incision was made in the cystic duct. A cholangiocatheter was introduced through the anterior abdominal wall and placed in the cystic duct. A intraoperative cholangiogram was then performed.  Under real-time fluoroscopy, dilute contrast was injected into the cystic duct.  The common hepatic duct, the right and left hepatic ducts, and the common duct were all visualized. Contrast drained into the duodenum without obvious evidence of any obstructing ductal lesion. The final report is pending the Radiologist's interpretation.  The cholangiocatheter was removed, the cystic duct was clipped 3 times on the biliary side, and then the cystic duct was divided sharply. No bile leak was noted from the cystic duct stump.   Following this the gallbladder was dissected free from the liver using electrocautery. Some bile spilled out of a small puncture in the gallbladder and this was evacuated via suction.  The gallbladder was then placed in a retrieval bag and removed from the abdominal cavity through the subumbilical incision.  The gallbladder fossa was inspected, irrigated, and bleeding was controlled with electrocautery. Inspection showed that hemostasis was adequate and there was no evidence of bile leak.  The irrigation fluid was evacuated as much as possible.  The subumbilical trocar was removed and the fascial defect was closed by tightening and tying down the pursestring suture under laparoscopic vision.  The remaining trochars were removed and the pneumoperitoneum was released. The skin incisions were closed with 4-0 Monocryl subcuticular stitches. Steri-Strips and sterile dressings were applied.  The procedure was well-tolerated without any apparent complications. The patient was taken to the recovery room in satisfactory condition.

## 2011-10-08 NOTE — Anesthesia Procedure Notes (Signed)
Procedure Name: Intubation Date/Time: 10/08/2011 7:49 AM Performed by: Delbert Harness Pre-anesthesia Checklist: Patient identified, Timeout performed, Emergency Drugs available, Suction available and Patient being monitored Patient Re-evaluated:Patient Re-evaluated prior to inductionOxygen Delivery Method: Circle system utilized Preoxygenation: Pre-oxygenation with 100% oxygen Intubation Type: IV induction Ventilation: Mask ventilation without difficulty and Oral airway inserted - appropriate to patient size Laryngoscope Size: Mac and 3 Grade View: Grade I Tube type: Oral Tube size: 7.0 mm Number of attempts: 1 Airway Equipment and Method: Stylet and LTA kit utilized Placement Confirmation: ETT inserted through vocal cords under direct vision,  positive ETCO2 and breath sounds checked- equal and bilateral Secured at: 22 cm Tube secured with: Tape Dental Injury: Teeth and Oropharynx as per pre-operative assessment

## 2011-10-08 NOTE — H&P (Signed)
[name] is an 56 y.o. female.   Chief Complaint:   Here for gallbladder surgery HPI:   56 year old female with symptomatic biliary dyskinesia.  Here for elective laparoscopic cholecystectomy.  Past Medical History  Diagnosis Date  . Arthritis   . Asthma   . GERD (gastroesophageal reflux disease)   . Trouble swallowing   . Abdominal distension   . Abdominal pain   . Constipation   . Diarrhea   . Arthritis pain   . Chronic kidney disease     kidney stones  . H/O hiatal hernia   . Biliary dyskinesia   . Spondylolisthesis of lumbosacral region     Past Surgical History  Procedure Date  . Abdominal hysterectomy   . Tubal ligation   . Vein ligation and stripping     Family History  Problem Relation Age of Onset  . Cancer Mother     colon  . Heart disease Father   . Cancer Sister     lung/squamous cell   Social History:  reports that she has quit smoking. She quit smokeless tobacco use about 30 years ago. She reports that she drinks about .6 ounces of alcohol per week. She reports that she does not use illicit drugs.  Allergies:  Allergies  Allergen Reactions  . Penicillins Anaphylaxis  . Morphine And Related Other (See Comments)    Low b/p.[name]...hallucinations  . Erythromycin Hives and Rash    Medications Prior to Admission  Medication Dose Route Frequency Provider Last Rate Last Dose  . ciprofloxacin (CIPRO) IVPB 400 mg  400 mg Intravenous 120 min pre-op Dawn Pollack, MD      . scopolamine (TRANSDERM-SCOP) 1.5 MG 1.5 mg  1 patch Transdermal To [address] Dawn Ben, MD       Medications Prior to Admission  Medication Sig Dispense Refill  . gabapentin (NEURONTIN) 300 MG capsule Take 300 mg by mouth 3 (three) times daily.      Marland Kitchen oxyCODONE-acetaminophen (PERCOCET) 5-325 MG per tablet Take 1 tablet by mouth every 6 (six) hours as needed. For pain      . pantoprazole (PROTONIX) 40 MG tablet Take 40 mg by mouth daily.         No results found for this  or any previous visit (from the past 48 hour(s)). No results found.  Review of Systems  Constitutional: Negative for fever and chills.  Gastrointestinal: Negative for nausea, vomiting, abdominal pain and diarrhea.    Blood pressure 103/70, pulse 62, temperature 97.7 F (36.5 C), temperature source Oral, resp. rate 16, last menstrual period 08/19/2011, SpO2 95.00%. Physical Exam  Constitutional: She appears well-developed and well-nourished. No distress.  Eyes: Conjunctivae are normal. No scleral icterus.  Cardiovascular: Normal rate and regular rhythm.   Respiratory: Effort normal and breath sounds normal.  GI: She exhibits no distension and no mass. There is no tenderness.  Musculoskeletal: She exhibits no edema.  Skin: Skin is warm and dry.     Assessment/Plan 1.  Symptomatic biliary dyskinesia.  2.  Multiple kidney stones-needs a 24 hour urine for Calcium still and Vit D level.  Will do this postop.  Plan:  Laparoscopic cholecystectomy today.  [name] J 10/08/2011, 7:30 AM

## 2011-10-08 NOTE — Transfer of Care (Signed)
Immediate Anesthesia Transfer of Care Note  Patient: [name]  Procedure(s) Performed: Procedure(s) (LRB): LAPAROSCOPIC CHOLECYSTECTOMY WITH INTRAOPERATIVE CHOLANGIOGRAM (N/A)  Patient Location: PACU  Anesthesia Type: General  Level of Consciousness: awake and alert   Airway & Oxygen Therapy: Patient Spontanous Breathing and Patient connected to nasal cannula oxygen  Post-op Assessment: Report given to PACU RN and Post -op Vital signs reviewed and stable  Post vital signs: Reviewed and stable  Complications: No apparent anesthesia complications

## 2011-10-08 NOTE — Discharge Instructions (Signed)
[facility] ______[facility] LAPAROSCOPIC SURGERY: POST OP INSTRUCTIONS Always review your discharge instruction sheet given to you by the facility where your surgery was performed. IF YOU HAVE DISABILITY OR FAMILY LEAVE FORMS, YOU MUST BRING THEM TO THE OFFICE FOR PROCESSING.   DO NOT GIVE THEM TO YOUR DOCTOR.  1. A prescription for pain medication may be given to you upon discharge.  Take your pain medication as prescribed, if needed.  If narcotic pain medicine is not needed, then you may take acetaminophen (Tylenol) or ibuprofen (Advil) as needed. 2. Take your usually prescribed medications unless otherwise directed. 3. If you need a refill on your pain medication, please contact your pharmacy.  They will contact our office to request authorization. Prescriptions will not be filled after 5pm or on week-ends. 4. You should follow a light diet the first few days after arrival home, such as soup and crackers, etc.  Be sure to include lots of fluids daily. 5. Most patients will experience some swelling and bruising in the area of the incisions.  Ice packs will help.  Swelling and bruising can take several days to resolve.  6. It is common to experience some constipation if taking pain medication after surgery.  Increasing fluid intake and taking a stool softener (such as Colace) will usually help or prevent this problem from occurring.  A mild laxative (Milk of Magnesia or Miralax) should be taken according to package instructions if there are no bowel movements after 48 hours. 7. Unless discharge instructions indicate otherwise, you may remove your bandages 72 hours after surgery, and you may shower at that time.  You may have steri-strips (small skin tapes) in place directly over the incision.  These strips should be left on the skin.  If your surgeon used skin glue on the incision, you may shower in 24 hours.  The glue will flake off over the next 2-3 weeks.  Any sutures or staples will be  removed at the office during your follow-up visit. 8. ACTIVITIES:  You may resume regular (light) daily activities beginning the next day--such as daily self-care, walking, climbing stairs--gradually increasing activities as tolerated.  You may have sexual intercourse when it is comfortable.  Refrain from any heavy lifting or straining-nothing over 10 pounds for 2 weeks. a. You may drive when you are no longer taking prescription pain medication, you can comfortably wear a seatbelt, and you can safely maneuver your car and apply brakes. b. RETURN TO WORK:  ________2-4 weeks.__________________________________________________ 9. You should see your doctor in the office for a follow-up appointment approximately 2-3 weeks after your surgery.  Make sure that you call for this appointment within a day or two after you arrive home to insure a convenient appointment time. 10. OTHER INSTRUCTIONS: __________________________________________________________________________________________________________________________ __________________________________________________________________________________________________________________________ WHEN TO CALL YOUR DOCTOR: 1. Fever over 101.5 2. Inability to urinate 3. Continued bleeding from incision. 4. Increased pain, redness, or drainage from the incision. 5. Increasing abdominal pain  The clinic staff is available to answer your questions during regular business hours.  Please don't hesitate to call and ask to speak to one of the nurses for clinical concerns.  If you have a medical emergency, go to the nearest emergency room or call 911.  A surgeon from [facility] is always on call at the hospital. [address], [place], [place]  [address] ? [address] [address], [place], [place]   [address] [phone] ? [phone] ? FAX [phone] Web site: [email or web address]

## 2011-10-08 NOTE — Anesthesia Preprocedure Evaluation (Addendum)
Anesthesia Evaluation  Patient identified by MRN, date of birth, ID band Patient awake    Reviewed: Allergy & Precautions, H&P , NPO status , Patient's Chart, lab work & pertinent test results  History of Anesthesia Complications (+) PONV  Airway Mallampati: II TM Distance: >3 FB Neck ROM: Full    Dental No notable dental hx. (+) Teeth Intact and Dental Advisory Given   Pulmonary asthma (daily Advair) , former smoker breath sounds clear to auscultation  Pulmonary exam normal       Cardiovascular negative cardio ROS  Rhythm:Regular Rate:Normal     Neuro/Psych negative neurological ROS  negative psych ROS   GI/Hepatic Neg liver ROS, hiatal hernia, GERD-  Medicated and Controlled,  Endo/Other  negative endocrine ROS  Renal/GU negative Renal ROSRemote h/q kidney stones  negative genitourinary   Musculoskeletal   Abdominal Normal abdominal exam  (+) + obese,   Peds  Hematology negative hematology ROS (+)   Anesthesia Other Findings   Reproductive/Obstetrics negative OB ROS                         Anesthesia Physical Anesthesia Plan  ASA: II  Anesthesia Plan: General   Post-op Pain Management:    Induction: Intravenous  Airway Management Planned: Oral ETT  Additional Equipment:   Intra-op Plan:   Post-operative Plan: Extubation in OR  Informed Consent: I have reviewed the patients History and Physical, chart, labs and discussed the procedure including the risks, benefits and alternatives for the proposed anesthesia with the patient or authorized representative who has indicated his/her understanding and acceptance.   Dental advisory given  Plan Discussed with: CRNA and Surgeon  Anesthesia Plan Comments: (Plan routine monitors, GETA)        Anesthesia Quick Evaluation

## 2011-10-08 NOTE — Anesthesia Postprocedure Evaluation (Signed)
  Anesthesia Post-op Note  Patient: [name]  Procedure(s) Performed: Procedure(s) (LRB): LAPAROSCOPIC CHOLECYSTECTOMY WITH INTRAOPERATIVE CHOLANGIOGRAM (N/A)  Patient Location: PACU  Anesthesia Type: General  Level of Consciousness: awake, alert  and oriented  Airway and Oxygen Therapy: Patient Spontanous Breathing and Patient connected to nasal cannula oxygen  Post-op Pain: mild  Post-op Assessment: Post-op Vital signs reviewed, Patient's Cardiovascular Status Stable, Respiratory Function Stable, Patent Airway, No signs of Nausea or vomiting and Pain level controlled  Post-op Vital Signs: Reviewed and stable  Complications: No apparent anesthesia complications

## 2011-10-11 ENCOUNTER — Encounter (HOSPITAL_COMMUNITY): Payer: Self-pay | Admitting: General Surgery

## 2011-11-01 ENCOUNTER — Telehealth (INDEPENDENT_AMBULATORY_CARE_PROVIDER_SITE_OTHER): Payer: Self-pay | Admitting: General Surgery

## 2011-11-04 ENCOUNTER — Other Ambulatory Visit (INDEPENDENT_AMBULATORY_CARE_PROVIDER_SITE_OTHER): Payer: Self-pay | Admitting: General Surgery

## 2011-11-04 DIAGNOSIS — N200 Calculus of kidney: Secondary | ICD-10-CM

## 2011-11-08 ENCOUNTER — Other Ambulatory Visit (INDEPENDENT_AMBULATORY_CARE_PROVIDER_SITE_OTHER): Payer: Self-pay | Admitting: General Surgery

## 2011-11-09 ENCOUNTER — Other Ambulatory Visit (INDEPENDENT_AMBULATORY_CARE_PROVIDER_SITE_OTHER): Payer: Self-pay | Admitting: General Surgery

## 2011-11-09 LAB — VITAMIN D 25 HYDROXY (VIT D DEFICIENCY, FRACTURES): Vit D, 25-Hydroxy: 57 ng/mL (ref 30–89)

## 2011-11-10 LAB — CALCIUM, URINE, 24 HOUR: Calcium, Ur: 32 mg/dL

## 2011-11-12 ENCOUNTER — Encounter (INDEPENDENT_AMBULATORY_CARE_PROVIDER_SITE_OTHER): Payer: 59 | Admitting: General Surgery

## 2011-11-16 ENCOUNTER — Encounter (INDEPENDENT_AMBULATORY_CARE_PROVIDER_SITE_OTHER): Payer: Self-pay

## 2011-11-16 ENCOUNTER — Encounter (INDEPENDENT_AMBULATORY_CARE_PROVIDER_SITE_OTHER): Payer: Self-pay | Admitting: General Surgery

## 2011-11-16 ENCOUNTER — Ambulatory Visit (INDEPENDENT_AMBULATORY_CARE_PROVIDER_SITE_OTHER): Payer: 59 | Admitting: General Surgery

## 2011-11-16 VITALS — BP 118/85 | HR 71 | Temp 98.3°F | Ht 65.0 in | Wt 172.8 lb

## 2011-11-16 DIAGNOSIS — R82994 Hypercalciuria: Secondary | ICD-10-CM | POA: Insufficient documentation

## 2011-11-16 NOTE — Patient Instructions (Signed)
Low calcium diet.  We will refer you to an Endocrinologist.

## 2011-11-16 NOTE — Progress Notes (Signed)
Operation: Laparoscopic cholecystectomy with cholangiogram for biliary dyskinesia  Date: October 08, 2011  Pathology: Benign  HPI: She is here for her first postoperative visit. I also got a vitamin D level on her which was normal at 57. We did a 24-hour urine for calcium and this was elevated at 320 mg per day. Of note was that her calcium level in parathyroid hormone levels are both normal. She has multiple kidney stones. As for her postoperative course, her preoperative symptoms after eating her gone and she feels very good about this.  PE: Abdomen-incisions are clean, intact, and solid.  Assessment:  Post laparoscopic cholecystectomy-doing well from this. Hypercalciuria with normal serum calcium and PTH level and multiple kidney stones. I do not know the etiology of her kidney stones at this time.  I do not think it's hyperparathyroidism.  Plan: May return to work. Low calcium diet.  Will refer her to an endocrinologist.

## 2011-11-17 ENCOUNTER — Other Ambulatory Visit (INDEPENDENT_AMBULATORY_CARE_PROVIDER_SITE_OTHER): Payer: Self-pay | Admitting: General Surgery

## 2011-11-17 DIAGNOSIS — R82994 Hypercalciuria: Secondary | ICD-10-CM

## 2012-04-11 ENCOUNTER — Other Ambulatory Visit: Payer: Self-pay | Admitting: Dermatology

## 2012-07-07 ENCOUNTER — Emergency Department (HOSPITAL_COMMUNITY): Payer: 59

## 2012-07-07 ENCOUNTER — Emergency Department (HOSPITAL_COMMUNITY)
Admission: EM | Admit: 2012-07-07 | Discharge: 2012-07-07 | Disposition: A | Discharge status: Home / Self Care | Payer: 59 | Attending: Emergency Medicine | Admitting: Emergency Medicine

## 2012-07-07 ENCOUNTER — Encounter (HOSPITAL_COMMUNITY): Payer: Self-pay | Admitting: *Deleted

## 2012-07-07 DIAGNOSIS — Z90710 Acquired absence of both cervix and uterus: Secondary | ICD-10-CM | POA: Insufficient documentation

## 2012-07-07 DIAGNOSIS — N189 Chronic kidney disease, unspecified: Secondary | ICD-10-CM | POA: Insufficient documentation

## 2012-07-07 DIAGNOSIS — J45909 Unspecified asthma, uncomplicated: Secondary | ICD-10-CM | POA: Insufficient documentation

## 2012-07-07 DIAGNOSIS — M129 Arthropathy, unspecified: Secondary | ICD-10-CM | POA: Insufficient documentation

## 2012-07-07 DIAGNOSIS — N23 Unspecified renal colic: Secondary | ICD-10-CM | POA: Insufficient documentation

## 2012-07-07 DIAGNOSIS — R112 Nausea with vomiting, unspecified: Secondary | ICD-10-CM | POA: Insufficient documentation

## 2012-07-07 DIAGNOSIS — Z79899 Other long term (current) drug therapy: Secondary | ICD-10-CM | POA: Insufficient documentation

## 2012-07-07 DIAGNOSIS — Z8739 Personal history of other diseases of the musculoskeletal system and connective tissue: Secondary | ICD-10-CM | POA: Insufficient documentation

## 2012-07-07 DIAGNOSIS — Z9089 Acquired absence of other organs: Secondary | ICD-10-CM | POA: Insufficient documentation

## 2012-07-07 DIAGNOSIS — K219 Gastro-esophageal reflux disease without esophagitis: Secondary | ICD-10-CM | POA: Insufficient documentation

## 2012-07-07 DIAGNOSIS — Z87891 Personal history of nicotine dependence: Secondary | ICD-10-CM | POA: Insufficient documentation

## 2012-07-07 LAB — URINALYSIS, ROUTINE W REFLEX MICROSCOPIC
Bilirubin Urine: NEGATIVE
Glucose, UA: NEGATIVE mg/dL
Protein, ur: NEGATIVE mg/dL

## 2012-07-07 LAB — COMPREHENSIVE METABOLIC PANEL
CO2: 26 mEq/L (ref 19–32)
Calcium: 10 mg/dL (ref 8.4–10.5)
Creatinine, Ser: 0.7 mg/dL (ref 0.50–1.10)
GFR calc Af Amer: >90 mL/min (ref >90)
GFR calc non Af Amer: >90 mL/min (ref >90)
Glucose, Bld: 96 mg/dL (ref 70–99)

## 2012-07-07 LAB — CBC WITH DIFFERENTIAL/PLATELET
Basophils Absolute: 0 10*3/uL (ref 0.0–0.1)
Eosinophils Relative: 1 % (ref 0–5)
HCT: 38.7 % (ref 36.0–46.0)
Lymphocytes Relative: 46 % (ref 12–46)
Lymphs Abs: 2.7 10*3/uL (ref 0.7–4.0)
MCH: 32.1 pg (ref 26.0–34.0)
MCV: 93.5 fL (ref 78.0–100.0)
Monocytes Absolute: 0.6 10*3/uL (ref 0.1–1.0)
RDW: 13 % (ref 11.5–15.5)
WBC: 5.9 10*3/uL (ref 4.0–10.5)

## 2012-07-07 LAB — URINE MICROSCOPIC-ADD ON

## 2012-07-07 MED ORDER — KETOROLAC TROMETHAMINE 30 MG/ML IJ SOLN
30.0000 mg | Freq: Once | INTRAMUSCULAR | Status: AC
  Administered 2012-07-07: 30 mg via INTRAVENOUS
  Filled 2012-07-07: qty 1

## 2012-07-07 MED ORDER — HYDROCODONE-ACETAMINOPHEN 5-325 MG PO TABS: 2.0000 | ORAL_TABLET | ORAL | Status: DC | PRN

## 2012-07-07 MED ORDER — TAMSULOSIN HCL 0.4 MG PO CAPS: 0.4000 mg | ORAL_CAPSULE | Freq: Every day | ORAL | Status: DC

## 2012-07-07 MED ORDER — HYDROMORPHONE HCL PF 1 MG/ML IJ SOLN
1.0000 mg | Freq: Once | INTRAMUSCULAR | Status: AC
  Administered 2012-07-07: 1 mg via INTRAVENOUS
  Filled 2012-07-07: qty 1

## 2012-07-07 MED ORDER — SODIUM CHLORIDE 0.9 % IV BOLUS (SEPSIS)
1000.0000 mL | Freq: Once | INTRAVENOUS | Status: AC
  Administered 2012-07-07: 1000 mL via INTRAVENOUS

## 2012-07-07 MED ORDER — ONDANSETRON HCL 4 MG/2ML IJ SOLN
4.0000 mg | Freq: Once | INTRAMUSCULAR | Status: AC
  Administered 2012-07-07: 4 mg via INTRAVENOUS
  Filled 2012-07-07: qty 2

## 2012-07-07 NOTE — ED Notes (Signed)
Patient transported to CT 

## 2012-07-07 NOTE — ED Provider Notes (Signed)
History     CSN: [id number]  Arrival date & time 07/07/12  2006   First MD Initiated Contact with Patient 07/07/12 2024      Chief Complaint  Patient presents with  . Flank Pain    (Consider location/radiation/quality/duration/timing/severity/associated sxs/prior treatment) The history is provided by the patient.  KARILYNN CARRANZA is a 56 y.o. female hx of GERD, multiple kidney stones s/p lithotripsy here with L flank pain. Acute onset of left flank pain while eating dinner tonight. It was sharp, radiating to her groin, and very severe. She had some nausea and vomiting. Denies any hematuria or dysuria. No fevers or chills. Felt like her previous kidney stones.   Past Medical History  Diagnosis Date  . Arthritis   . Asthma   . GERD (gastroesophageal reflux disease)   . Trouble swallowing   . Abdominal distension   . Abdominal pain   . Constipation   . Diarrhea   . Arthritis pain   . Chronic kidney disease     kidney stones  . H/O hiatal hernia   . Biliary dyskinesia   . Spondylolisthesis of lumbosacral region     Past Surgical History  Procedure Date  . Abdominal hysterectomy   . Tubal ligation   . Vein ligation and stripping   . Cholecystectomy 10/08/2011    Procedure: LAPAROSCOPIC CHOLECYSTECTOMY WITH INTRAOPERATIVE CHOLANGIOGRAM;  Surgeon: Adolph Pollack, MD;  Location: [facility] OR;  Service: General;  Laterality: N/A;    Family History  Problem Relation Age of Onset  . Cancer Mother     colon  . Heart disease Father   . Cancer Sister     lung/squamous cell    History  Substance Use Topics  . Smoking status: Former Games developer  . Smokeless tobacco: Former Neurosurgeon    Quit date: 09/07/1981  . Alcohol Use: 0.6 oz/week    1 Glasses of wine per week     Comment: weekly    OB History    Grav Para Term Preterm Abortions TAB SAB Ect Mult Living                  Review of Systems  Gastrointestinal: Positive for nausea, vomiting and abdominal pain.  All other systems  reviewed and are negative.    Allergies  Penicillins; Latex; Morphine and related; and Erythromycin  Home Medications   Current Outpatient Rx  Name  Route  Sig  Dispense  Refill  . GABAPENTIN 300 MG PO CAPS   Oral   Take 300 mg by mouth 2 (two) times daily.          . OXYCODONE-ACETAMINOPHEN 10-650 MG PO TABS   Oral   Take 1 tablet by mouth at bedtime as needed. For pain management         . PANTOPRAZOLE SODIUM 40 MG PO TBEC   Oral   Take 40 mg by mouth every morning.            BP 145/90  Pulse 63  Temp 97.6 F (36.4 C) (Oral)  Resp 15  SpO2 100%  LMP 08/19/2011  Physical Exam  Nursing note and vitals reviewed. Constitutional: She is oriented to person, place, and time. She appears well-developed and well-nourished.       Uncomfortable, writhing around   HENT:  Head: Normocephalic.  Mouth/Throat: Oropharynx is clear and moist.  Eyes: Conjunctivae normal are normal. Pupils are equal, round, and reactive to light.  Neck: Normal range of motion. Neck supple.  Cardiovascular: Normal rate, regular rhythm and normal heart sounds.   Pulmonary/Chest: Effort normal and breath sounds normal. No respiratory distress. She has no wheezes. She has no rales.  Abdominal: Soft.       + LLQ tenderness. Mild L CVAT   Musculoskeletal: Normal range of motion.  Neurological: She is alert and oriented to person, place, and time.  Skin: Skin is warm and dry.  Psychiatric: She has a normal mood and affect. Her behavior is normal. Judgment and thought content normal.    ED Course  Procedures (including critical care time)  Labs Reviewed  URINALYSIS, ROUTINE W REFLEX MICROSCOPIC - Abnormal; Notable for the following:    APPearance CLOUDY (*)     Specific Gravity, Urine 1.031 (*)     Hgb urine dipstick LARGE (*)     All other components within normal limits  URINE MICROSCOPIC-ADD ON - Abnormal; Notable for the following:    Squamous Epithelial / LPF FEW (*)     Bacteria, UA  FEW (*)     Crystals CA OXALATE CRYSTALS (*)     All other components within normal limits  CBC WITH DIFFERENTIAL  COMPREHENSIVE METABOLIC PANEL  LIPASE, BLOOD   Ct Abdomen Pelvis Wo Contrast  07/07/2012  *RADIOLOGY REPORT*  Clinical Data: Left flank pain  CT ABDOMEN AND PELVIS WITHOUT CONTRAST  Technique:  Multidetector CT imaging of the abdomen and pelvis was performed following the standard protocol without intravenous contrast.  Comparison: 07/30/2011  Findings: Lung bases are predominately clear.  Heart size within normal limits.  No pleural or pericardial effusion.  Organ abnormality/lesion detection is limited in the absence of intravenous contrast. Within this limitation, unremarkable liver, spleen, pancreas, adrenal glands.  Absent gallbladder.  No biliary ductal dilatation.  Numerous right-sided renal stones and a couple tiny left renal stones.  Exophytic hypodensity arising in the right kidney posteriorly measures water density.  There is no hydronephrosis or hydroureter on the right.  There is mild pelvicaliectasis on the left to the level of a 3 mm UPJ stone.  Unable to follow the ureteral course in their entirety due to the decompressed state. There are many bilateral pelvic calcifications in proximity to the UVJs.  No bowel obstruction.  Colonic diverticulosis.  No CT evidence for diverticulitis or colitis.  Normal appendix.  No free intraperitoneal air or fluid.  No lymphadenopathy.  Normal caliber aorta and branch vessels.  Decompressed bladder.  Absent uterus.  No adnexal mass.  Multilevel degenerative changes of the imaged spine. No acute or aggressive appearing osseous lesion. Grade 1 anterolisthesis of L5 on S1.  IMPRESSION: Mild left pelvicaliectasis to the level of a 3 mm UPJ stone.  Additional bilateral nonobstructing renal stones.  Numerous pelvic calcifications are favored to reflect phlebolith.   Original Report Authenticated By: Jearld Lesch, M.D.      No diagnosis  found.    MDM  YARIMA PENMAN is a 56 y.o. female here with L flank pain, likely renal colic. Will get labs, CT ab/pel. Will give pain meds, zofran and reassess.   9:59 PM Labs unremarkable. UA + hematuria but no UTI. CT showed L 3mm UPJ stone. Pain improved with pain meds. Will d/c home on vicodin, motrin, and flomax. She will f/u with her own urologist.       Richardean Canal, MD 07/07/12 2200

## 2012-07-07 NOTE — ED Notes (Signed)
Pt c/o L flank pain starting tonight w/ nausea, denies vomiting and hematuria. Pt has hx of kidney stones w/ known kidney stones in L kidney.

## 2013-09-10 ENCOUNTER — Other Ambulatory Visit: Payer: Self-pay | Admitting: Gastroenterology

## 2014-04-29 ENCOUNTER — Other Ambulatory Visit: Payer: Self-pay | Admitting: Obstetrics & Gynecology

## 2015-11-13 ENCOUNTER — Encounter (HOSPITAL_COMMUNITY): Payer: Self-pay

## 2015-11-13 ENCOUNTER — Emergency Department (HOSPITAL_COMMUNITY): Payer: 59

## 2015-11-13 ENCOUNTER — Emergency Department (HOSPITAL_COMMUNITY)
Admission: EM | Admit: 2015-11-13 | Discharge: 2015-11-13 | Disposition: A | Discharge status: Home / Self Care | Payer: 59 | Attending: Emergency Medicine | Admitting: Emergency Medicine

## 2015-11-13 DIAGNOSIS — N390 Urinary tract infection, site not specified: Secondary | ICD-10-CM | POA: Diagnosis not present

## 2015-11-13 DIAGNOSIS — N189 Chronic kidney disease, unspecified: Secondary | ICD-10-CM | POA: Diagnosis not present

## 2015-11-13 DIAGNOSIS — R109 Unspecified abdominal pain: Secondary | ICD-10-CM

## 2015-11-13 DIAGNOSIS — Z90710 Acquired absence of both cervix and uterus: Secondary | ICD-10-CM | POA: Insufficient documentation

## 2015-11-13 DIAGNOSIS — K219 Gastro-esophageal reflux disease without esophagitis: Secondary | ICD-10-CM | POA: Insufficient documentation

## 2015-11-13 DIAGNOSIS — Z91040 Latex allergy status: Secondary | ICD-10-CM | POA: Insufficient documentation

## 2015-11-13 DIAGNOSIS — Z9049 Acquired absence of other specified parts of digestive tract: Secondary | ICD-10-CM | POA: Insufficient documentation

## 2015-11-13 DIAGNOSIS — Z880 Allergy status to penicillin: Secondary | ICD-10-CM | POA: Insufficient documentation

## 2015-11-13 DIAGNOSIS — M1990 Unspecified osteoarthritis, unspecified site: Secondary | ICD-10-CM | POA: Diagnosis not present

## 2015-11-13 DIAGNOSIS — Z9851 Tubal ligation status: Secondary | ICD-10-CM | POA: Diagnosis not present

## 2015-11-13 DIAGNOSIS — N200 Calculus of kidney: Secondary | ICD-10-CM | POA: Diagnosis not present

## 2015-11-13 DIAGNOSIS — Z87891 Personal history of nicotine dependence: Secondary | ICD-10-CM | POA: Diagnosis not present

## 2015-11-13 DIAGNOSIS — Z79899 Other long term (current) drug therapy: Secondary | ICD-10-CM | POA: Diagnosis not present

## 2015-11-13 DIAGNOSIS — J45909 Unspecified asthma, uncomplicated: Secondary | ICD-10-CM | POA: Diagnosis not present

## 2015-11-13 LAB — URINALYSIS, ROUTINE W REFLEX MICROSCOPIC
Bilirubin Urine: NEGATIVE
Glucose, UA: NEGATIVE mg/dL
Hgb urine dipstick: NEGATIVE
Ketones, ur: NEGATIVE mg/dL
Nitrite: NEGATIVE
Protein, ur: NEGATIVE mg/dL
Specific Gravity, Urine: 1.031 — ABNORMAL HIGH (ref 1.005–1.030)
pH: 6 (ref 5.0–8.0)

## 2015-11-13 LAB — CBC WITH DIFFERENTIAL/PLATELET
Basophils Absolute: 0 10*3/uL (ref 0.0–0.1)
Basophils Relative: 0 %
Eosinophils Absolute: 0.1 10*3/uL (ref 0.0–0.7)
Eosinophils Relative: 1 %
HCT: 38.6 % (ref 36.0–46.0)
Hemoglobin: 13.4 g/dL (ref 12.0–15.0)
Lymphocytes Relative: 19 %
Lymphs Abs: 1.7 10*3/uL (ref 0.7–4.0)
MCH: 31.9 pg (ref 26.0–34.0)
MCHC: 34.7 g/dL (ref 30.0–36.0)
MCV: 91.9 fL (ref 78.0–100.0)
Monocytes Absolute: 0.6 10*3/uL (ref 0.1–1.0)
Monocytes Relative: 7 %
Neutro Abs: 6.7 10*3/uL (ref 1.7–7.7)
Neutrophils Relative %: 73 %
Platelets: 271 10*3/uL (ref 150–400)
RBC: 4.2 MIL/uL (ref 3.87–5.11)
RDW: 12.9 % (ref 11.5–15.5)
WBC: 9.1 10*3/uL (ref 4.0–10.5)

## 2015-11-13 LAB — COMPREHENSIVE METABOLIC PANEL
ALT: 38 U/L (ref 14–54)
AST: 33 U/L (ref 15–41)
Albumin: 4.5 g/dL (ref 3.5–5.0)
Alkaline Phosphatase: 72 U/L (ref 38–126)
Anion gap: 13 (ref 5–15)
BUN: 16 mg/dL (ref 6–20)
CO2: 22 mmol/L (ref 22–32)
Calcium: 9.4 mg/dL (ref 8.9–10.3)
Chloride: 107 mmol/L (ref 101–111)
Creatinine, Ser: 0.67 mg/dL (ref 0.44–1.00)
GFR calc Af Amer: >60 mL/min (ref >60)
GFR calc non Af Amer: >60 mL/min (ref >60)
Glucose, Bld: 125 mg/dL — ABNORMAL HIGH (ref 65–99)
Potassium: 3.6 mmol/L (ref 3.5–5.1)
Sodium: 142 mmol/L (ref 135–145)
Total Bilirubin: 0.7 mg/dL (ref 0.3–1.2)
Total Protein: 7.6 g/dL (ref 6.5–8.1)

## 2015-11-13 LAB — URINE MICROSCOPIC-ADD ON: Squamous Epithelial / LPF: NONE SEEN

## 2015-11-13 LAB — LIPASE, BLOOD: Lipase: 23 U/L (ref 11–51)

## 2015-11-13 MED ORDER — NITROFURANTOIN MONOHYD MACRO 100 MG PO CAPS
100.0000 mg | ORAL_CAPSULE | Freq: Once | ORAL | Status: AC
  Administered 2015-11-13: 100 mg via ORAL
  Filled 2015-11-13: qty 1

## 2015-11-13 MED ORDER — ONDANSETRON HCL 4 MG PO TABS: 4.0000 mg | ORAL_TABLET | Freq: Four times a day (QID) | ORAL | Status: DC

## 2015-11-13 MED ORDER — OXYCODONE-ACETAMINOPHEN 5-325 MG PO TABS: 1.0000 | ORAL_TABLET | Freq: Four times a day (QID) | ORAL | Status: DC | PRN

## 2015-11-13 MED ORDER — ONDANSETRON HCL 4 MG/2ML IJ SOLN
4.0000 mg | Freq: Once | INTRAMUSCULAR | Status: AC
  Administered 2015-11-13: 4 mg via INTRAVENOUS
  Filled 2015-11-13: qty 2

## 2015-11-13 MED ORDER — KETOROLAC TROMETHAMINE 30 MG/ML IJ SOLN: 30.0000 mg | Freq: Once | INTRAMUSCULAR | Status: DC

## 2015-11-13 MED ORDER — OXYCODONE-ACETAMINOPHEN 5-325 MG PO TABS
1.0000 | ORAL_TABLET | Freq: Once | ORAL | Status: AC
  Administered 2015-11-13: 1 via ORAL
  Filled 2015-11-13: qty 1

## 2015-11-13 MED ORDER — KETOROLAC TROMETHAMINE 30 MG/ML IJ SOLN
30.0000 mg | Freq: Once | INTRAMUSCULAR | Status: AC
  Administered 2015-11-13: 30 mg via INTRAVENOUS
  Filled 2015-11-13: qty 1

## 2015-11-13 MED ORDER — KETOROLAC TROMETHAMINE 10 MG PO TABS: 10.0000 mg | ORAL_TABLET | Freq: Four times a day (QID) | ORAL | Status: DC | PRN

## 2015-11-13 NOTE — ED Provider Notes (Signed)
CSN: [id number]     Arrival date & time 11/13/15  1916 History   First MD Initiated Contact with Patient 11/13/15 1926     Chief Complaint  Patient presents with  . Flank Pain    HPI   60 year old female presents today with complaints of any stones. Patient reports that she has a significant past medical history of the same, reporting that she has had 3 lithotripsies. Patient notes her last attack was approximately 5 years ago. Patient reports That she was driving home from work this evening "several hours prior to arrival) when she had episode of severe sharp left lank and lower abdominal pain. She reports this lasted approximately 1 minute and then had complete resolution. Patient notes that she was in no distress with a recurrence of the similar pain. She reports this feels similar to previous kidney stones, but notes a "metallic taste in her mouth" and worse pain in the abdomen. Patient denies any preceding illness including fever, chills, nausea, vomiting. She reports associated nausea with the pain, denies any vomiting or diarrhea. Patient denies any lower abdominal pain vaginal discharge or bleeding. Patient has had no medications prior to arrival.   Past Medical History  Diagnosis Date  . Arthritis   . Asthma   . GERD (gastroesophageal reflux disease)   . Trouble swallowing   . Abdominal distension   . Abdominal pain   . Constipation   . Diarrhea   . Arthritis pain   . Chronic kidney disease     kidney stones  . H/O hiatal hernia   . Biliary dyskinesia   . Spondylolisthesis of lumbosacral region    Past Surgical History  Procedure Laterality Date  . Abdominal hysterectomy    . Tubal ligation    . Vein ligation and stripping    . Cholecystectomy  10/08/2011    Procedure: LAPAROSCOPIC CHOLECYSTECTOMY WITH INTRAOPERATIVE CHOLANGIOGRAM;  Surgeon: Odis Hollingshead, MD;  Location: [facility] [place];  Service: General;  Laterality: N/A;   Family History  Problem Relation Age of Onset  .  Cancer Mother     colon  . Heart disease Father   . Cancer Sister     lung/squamous cell   Social History  Substance Use Topics  . Smoking status: Former Research scientist (life sciences)  . Smokeless tobacco: Former Systems developer    Quit date: 09/07/1981  . Alcohol Use: 0.6 oz/week    1 Glasses of wine per week     Comment: weekly   OB History    No data available     Review of Systems  All other systems reviewed and are negative.  Allergies  Penicillins; Latex; Morphine and related; and Erythromycin  Home Medications   Prior to Admission medications   Medication Sig Start Date End Date Taking? Authorizing Provider  gabapentin (NEURONTIN) 300 MG capsule Take 300 mg by mouth daily.    Yes Historical Provider, MD  HYDROcodone-acetaminophen (NORCO) 7.5-325 MG tablet TK 1 T PO EVERY 6 HRS PRN FOR PAIN 10/06/15  Yes Historical Provider, MD  omeprazole (PRILOSEC) 40 MG capsule Take 40 mg by mouth daily.   Yes Historical Provider, MD  traMADol (ULTRAM) 50 MG tablet TK 1 T PO BID PRN FOR PAIN 08/26/15  Yes Historical Provider, MD  HYDROcodone-acetaminophen (NORCO/VICODIN) 5-325 MG per tablet Take 2 tablets by mouth every 4 (four) hours as needed for pain. Patient not taking: Reported on 11/13/2015 07/07/12   Wandra Arthurs, MD  Tamsulosin HCl (FLOMAX) 0.4 MG CAPS Take 1  capsule (0.4 mg total) by mouth daily. Patient not taking: Reported on 11/13/2015 07/07/12   Wandra Arthurs, MD   BP 150/96 mmHg  Pulse 70  Temp(Src) 97.7 F (36.5 C) (Oral)  Resp 18  SpO2 99%  LMP 08/19/2011   Physical Exam  Constitutional: She is oriented to person, place, and time. She appears well-developed and well-nourished.  HENT:  Head: Normocephalic and atraumatic.  Eyes: Conjunctivae are normal. Pupils are equal, round, and reactive to light. Right eye exhibits no discharge. Left eye exhibits no discharge. No scleral icterus.  Neck: Normal range of motion. No JVD present. No tracheal deviation present.  Pulmonary/Chest: Effort normal. No  stridor.  Abdominal: Soft. She exhibits no distension and no mass. There is no tenderness. There is no rebound and no guarding.  Musculoskeletal: Normal range of motion. She exhibits no tenderness.  Neurological: She is alert and oriented to person, place, and time. Coordination normal.  Skin: Skin is warm and dry. No rash noted. No erythema. No pallor.  Psychiatric: She has a normal mood and affect. Her behavior is normal. Judgment and thought content normal.  Nursing note and vitals reviewed.   ED Course  Procedures (including critical care time) Labs Review Labs Reviewed  COMPREHENSIVE METABOLIC PANEL - Abnormal; Notable for the following:    Glucose, Bld 125 (*)    All other components within normal limits  URINALYSIS, ROUTINE W REFLEX MICROSCOPIC (NOT AT [facility]) - Abnormal; Notable for the following:    APPearance CLOUDY (*)    Specific Gravity, Urine 1.031 (*)    Leukocytes, UA MODERATE (*)    All other components within normal limits  URINE MICROSCOPIC-ADD ON - Abnormal; Notable for the following:    Bacteria, UA FEW (*)    Crystals CA OXALATE CRYSTALS (*)    All other components within normal limits  URINE CULTURE  CBC WITH DIFFERENTIAL/PLATELET  LIPASE, BLOOD    Imaging Review Ct Renal Stone Study  11/13/2015  CLINICAL DATA:  Patient with left flank pain for 2 days. Nausea. History of renal stones. EXAM: CT ABDOMEN AND PELVIS WITHOUT CONTRAST TECHNIQUE: Multidetector CT imaging of the abdomen and pelvis was performed following the standard protocol without IV contrast. COMPARISON:  CT abdomen pelvis 09/28/2013. FINDINGS: Lower chest: Normal heart size. Bandlike consolidation within the left lower lobe favored to represent atelectasis. No pleural effusion. Hepatobiliary: Interval development of a 2.5 cm low-attenuation lesion within the left hepatic lobe (image 24; series 2). Remainder of the liver is unremarkable. Patient status post cholecystectomy. Pancreas: Unremarkable  Spleen: Unremarkable Adrenals/Urinary Tract: The adrenal glands are normal. Unchanged 2 mm nonobstructing stone superior pole left kidney (image 42; series 3). Grossly unchanged stones within the right kidney measuring up to 6 mm within the inferior pole. Unchanged exophytic cyst off the lower pole of the right kidney. No ureterolithiasis. No hydronephrosis. Stomach/Bowel: Sigmoid colonic diverticulosis. No CT evidence for acute diverticulitis. No abnormal bowel wall thickening or evidence for bowel obstruction. The appendix is normal. Vascular/Lymphatic: Normal caliber abdominal aorta. No retroperitoneal lymphadenopathy. Other: Uterus to is surgically absent. Adnexal structures are unremarkable. Musculoskeletal: Lumbar spine degenerative changes. No aggressive or acute appearing osseous lesions. IMPRESSION: Interval development of a 2.5 cm nonspecific low-attenuation lesion within the liver. This needs dedicated evaluation with pre and post contrast-enhanced MRI in the non acute setting. Multiple nonobstructing stones within the right kidney. Tiny nonobstructing stone superior pole left kidney. No ureterolithiasis.  No hydronephrosis. Electronically Signed   By: Lovey Newcomer  M.D.   On: 11/13/2015 21:10   I have personally reviewed and evaluated these images and lab results as part of my medical decision-making.   EKG Interpretation None      MDM   Final diagnoses:  Left flank pain  Nephrolithiasis    Labs:  CBC, CMP, Lipase-   Imaging: CT renal   Consults:  Therapeutics:Toradol, Percocet  Discharge Meds: Nitrofurantoin, Toradol, Percocet  Assessment/Plan:60 year old female presents today with complaints of sharp pain in her left lower abdomen with radiation into the flank. She reports these come and go, identical to previous kidney stones. Patient scan shows nephrolithiasis in the bilateral kidneys, no signs of ureterolithiasis or hydronephrosis. Patient's urine shows white blood cells, few  bacteria. Patient's pain is consistent with previous kidney stones, although she has no blood in her urine, she may have passed a stone, her pain was treated here in the ED with dramatic improvement. Patient had no abdominal tenderness to palpation. Vital signs and labs are reassuring, she has no elevation in white blood cells, no abnormal kidney function. Upon further evaluation patient notes the pain is more in the left lower abdomen, with no CVA tenderness or flank pain. Patient does not have signs or symptoms consistent with pyelonephritis; additionally this was acute onset which also makes the likelihood of this being pyelonephritis less likely. Patient is nontoxic, afebrile in no acute distress, she is tolerating by mouth without difficulty. Patient will be treated for urinary tract infection, given pain medication, instructed to follow up closely with her urologist for further evaluation and management. Patient is instructed to return immediately to emergency room if any new or worsening signs or symptoms present. Patient verbalized understanding and agreement to today's plan had no further questions or concerns at time of discharge.   Patient was made aware of the incidental finding of abnormality of the liver and need for follow-up evaluation. She verbalized that she would follow up appropriately with her primary care.        Okey Regal, PA-C 11/13/15 2251  Okey Regal, PA-C 11/13/15 2252  Leonard Schwartz, MD 11/13/15 [phone]

## 2015-11-13 NOTE — Discharge Instructions (Signed)
Please follow-up with your urologist for further evaluation and management. Please return immediately to the emergency room if any new or worsening signs or symptoms present.  Abdominal Pain, Adult Many things can cause abdominal pain. Usually, abdominal pain is not caused by a disease and will improve without treatment. It can often be observed and treated at home. Your health care provider will do a physical exam and possibly order blood tests and X-rays to help determine the seriousness of your pain. However, in many cases, more time must pass before a clear cause of the pain can be found. Before that point, your health care provider may not know if you need more testing or further treatment. HOME CARE INSTRUCTIONS Monitor your abdominal pain for any changes. The following actions may help to alleviate any discomfort you are experiencing:  Only take over-the-counter or prescription medicines as directed by your health care provider.  Do not take laxatives unless directed to do so by your health care provider.  Try a clear liquid diet (broth, tea, or water) as directed by your health care provider. Slowly move to a bland diet as tolerated. SEEK MEDICAL CARE IF:  You have unexplained abdominal pain.  You have abdominal pain associated with nausea or diarrhea.  You have pain when you urinate or have a bowel movement.  You experience abdominal pain that wakes you in the night.  You have abdominal pain that is worsened or improved by eating food.  You have abdominal pain that is worsened with eating fatty foods.  You have a fever. SEEK IMMEDIATE MEDICAL CARE IF:  Your pain does not go away within 2 hours.  You keep throwing up (vomiting).  Your pain is felt only in portions of the abdomen, such as the right side or the left lower portion of the abdomen.  You pass bloody or black tarry stools. MAKE SURE YOU:  Understand these instructions.  Will watch your condition.  Will get  help right away if you are not doing well or get worse.   This information is not intended to replace advice given to you by your health care provider. Make sure you discuss any questions you have with your health care provider.   Document Released: 04/14/2005 Document Revised: 03/26/2015 Document Reviewed: 03/14/2013 Elsevier Interactive Patient Education [employer].

## 2015-11-13 NOTE — ED Notes (Signed)
Bed: WA03 Expected date:  Expected time:  Means of arrival:  Comments: EMS-kidney stone

## 2015-11-13 NOTE — ED Notes (Signed)
Per EMS- pt. C/o generalized abd pain radiating to groin. Nausea, vomiting. No hematuria. Hx of kidney stones, lithotripsy x3

## 2015-11-21 ENCOUNTER — Other Ambulatory Visit: Payer: Self-pay | Admitting: Gastroenterology

## 2015-11-21 DIAGNOSIS — K769 Liver disease, unspecified: Secondary | ICD-10-CM

## 2015-11-29 ENCOUNTER — Ambulatory Visit
Admission: RE | Admit: 2015-11-29 | Discharge: 2015-11-29 | Disposition: A | Discharge status: Home / Self Care | Payer: 59 | Source: Ambulatory Visit | Attending: Gastroenterology | Admitting: Gastroenterology

## 2015-11-29 DIAGNOSIS — K769 Liver disease, unspecified: Secondary | ICD-10-CM

## 2015-11-29 MED ORDER — GADOBENATE DIMEGLUMINE 529 MG/ML IV SOLN
15.0000 mL | Freq: Once | INTRAVENOUS | Status: AC | PRN
  Administered 2015-11-29: 15 mL via INTRAVENOUS

## 2016-03-19 HISTORY — PX: KNEE ARTHROSCOPY: SHX127

## 2016-05-19 DIAGNOSIS — D134 Benign neoplasm of liver: Secondary | ICD-10-CM

## 2016-05-19 HISTORY — DX: Benign neoplasm of liver: D13.4

## 2016-07-05 ENCOUNTER — Other Ambulatory Visit (HOSPITAL_COMMUNITY): Payer: Self-pay | Admitting: Orthopedic Surgery

## 2016-07-05 ENCOUNTER — Encounter (HOSPITAL_COMMUNITY): Payer: Self-pay

## 2016-07-05 ENCOUNTER — Ambulatory Visit (HOSPITAL_COMMUNITY)
Admission: RE | Admit: 2016-07-05 | Discharge: 2016-07-05 | Disposition: A | Discharge status: Home / Self Care | Payer: 59 | Source: Ambulatory Visit | Attending: Cardiovascular Disease | Admitting: Cardiovascular Disease

## 2016-07-05 DIAGNOSIS — M7989 Other specified soft tissue disorders: Secondary | ICD-10-CM | POA: Diagnosis not present

## 2016-07-05 DIAGNOSIS — E669 Obesity, unspecified: Secondary | ICD-10-CM | POA: Diagnosis not present

## 2016-07-05 DIAGNOSIS — Z87891 Personal history of nicotine dependence: Secondary | ICD-10-CM | POA: Diagnosis not present

## 2016-07-05 DIAGNOSIS — R52 Pain, unspecified: Secondary | ICD-10-CM | POA: Diagnosis not present

## 2016-07-05 DIAGNOSIS — Z86718 Personal history of other venous thrombosis and embolism: Secondary | ICD-10-CM | POA: Diagnosis not present

## 2016-07-05 DIAGNOSIS — M79605 Pain in left leg: Secondary | ICD-10-CM | POA: Diagnosis present

## 2016-07-05 DIAGNOSIS — M79604 Pain in right leg: Secondary | ICD-10-CM | POA: Diagnosis present

## 2016-07-05 NOTE — Progress Notes (Signed)
Today's right lower extremity venous duplex is negative for DVT. There is an anechoic, avascular mass in the right popliteal space: differential includes Baker's cyst. Preliminary results given to Dr. Gladstone Lighter.

## 2016-09-10 ENCOUNTER — Other Ambulatory Visit (HOSPITAL_BASED_OUTPATIENT_CLINIC_OR_DEPARTMENT_OTHER): Payer: Self-pay | Admitting: Internal Medicine

## 2016-09-10 ENCOUNTER — Ambulatory Visit (HOSPITAL_BASED_OUTPATIENT_CLINIC_OR_DEPARTMENT_OTHER)
Admission: RE | Admit: 2016-09-10 | Discharge: 2016-09-10 | Disposition: A | Discharge status: Home / Self Care | Payer: 59 | Source: Ambulatory Visit | Attending: Internal Medicine | Admitting: Internal Medicine

## 2016-09-10 DIAGNOSIS — M7989 Other specified soft tissue disorders: Secondary | ICD-10-CM | POA: Insufficient documentation

## 2016-09-10 DIAGNOSIS — M79605 Pain in left leg: Secondary | ICD-10-CM | POA: Diagnosis present

## 2016-11-16 ENCOUNTER — Encounter (HOSPITAL_COMMUNITY): Payer: Self-pay | Admitting: *Deleted

## 2016-12-10 NOTE — Patient Instructions (Addendum)
[name]  12/10/2016   Your procedure is scheduled on: 12-22-16   Report to [facility] Main  Entrance Take [place] Elevators to 3rd floor to [place] at 9:00 AM.    Call this number if you have problems the morning of surgery [phone]   Remember: ONLY 1 PERSON MAY GO WITH YOU TO SHORT STAY TO GET  READY MORNING OF [place].  Do not eat food or drink liquids :After Midnight.     Take these medicines the morning of surgery with A SIP OF WATER: Pantoprazole (Protonix), Gabapentin (Neurontin). You may also bring and use any  inhaler that you may need.                                You may not have any metal on your body including hair pins and              piercings  Do not wear jewelry, make-up, lotions, powders or perfumes, deodorant             Do not wear nail polish.  Do not shave  48 hours prior to surgery.         Do not bring valuables to the hospital. [place].  Contacts, dentures or bridgework may not be worn into surgery.  Leave suitcase in the car. After surgery it may be brought to your room.     Please read over the following fact sheets you were given: _____________________________________________________________________             [facility] - Preparing for Surgery Before surgery, you can play an important role.  Because skin is not sterile, your skin needs to be as free of germs as possible.  You can reduce the number of germs on your skin by washing with CHG (chlorahexidine gluconate) soap before surgery.  CHG is an antiseptic cleaner which kills germs and bonds with the skin to continue killing germs even after washing. Please DO NOT use if you have an allergy to CHG or antibacterial soaps.  If your skin becomes reddened/irritated stop using the CHG and inform your nurse when you arrive at Short Stay. Do not shave (including legs and underarms) for at least 48 hours prior to  the first CHG shower.  You may shave your face/neck. Please follow these instructions carefully:  1.  Shower with CHG Soap the night before surgery and the  morning of Surgery.  2.  If you choose to wash your hair, wash your hair first as usual with your  normal  shampoo.  3.  After you shampoo, rinse your hair and body thoroughly to remove the  shampoo.                           4.  Use CHG as you would any other liquid soap.  You can apply chg directly  to the skin and wash                       Gently with a scrungie or clean washcloth.  5.  Apply the CHG Soap to your body ONLY FROM THE NECK DOWN.  Do not use on face/ open                           Wound or open sores. Avoid contact with eyes, ears mouth and genitals (private parts).                       Wash face,  Genitals (private parts) with your normal soap.             6.  Wash thoroughly, paying special attention to the area where your surgery  will be performed.  7.  Thoroughly rinse your body with warm water from the neck down.  8.  DO NOT shower/wash with your normal soap after using and rinsing off  the CHG Soap.                9.  Pat yourself dry with a clean towel.            10.  Wear clean pajamas.            11.  Place clean sheets on your bed the night of your first shower and do not  sleep with pets. Day of Surgery : Do not apply any lotions/deodorants the morning of surgery.  Please wear clean clothes to the hospital/surgery center.  FAILURE TO FOLLOW THESE INSTRUCTIONS MAY RESULT IN THE CANCELLATION OF YOUR SURGERY PATIENT SIGNATURE_________________________________  NURSE SIGNATURE__________________________________  ________________________________________________________________________   [name]  An incentive spirometer is a tool that can help keep your lungs clear and active. This tool measures how well you are filling your lungs with each breath. Taking long deep breaths may help reverse or  decrease the chance of developing breathing (pulmonary) problems (especially infection) following:  A long period of time when you are unable to move or be active. BEFORE THE PROCEDURE   If the spirometer includes an indicator to show your best effort, your nurse or respiratory therapist will set it to a desired goal.  If possible, sit up straight or lean slightly forward. Try not to slouch.  Hold the incentive spirometer in an upright position. INSTRUCTIONS FOR USE  1. Sit on the edge of your bed if possible, or sit up as far as you can in bed or on a chair. 2. Hold the incentive spirometer in an upright position. 3. Breathe out normally. 4. Place the mouthpiece in your mouth and seal your lips tightly around it. 5. Breathe in slowly and as deeply as possible, raising the piston or the ball toward the top of the column. 6. Hold your breath for 3-5 seconds or for as long as possible. Allow the piston or ball to fall to the bottom of the column. 7. Remove the mouthpiece from your mouth and breathe out normally. 8. Rest for a few seconds and repeat Steps 1 through 7 at least 10 times every 1-2 hours when you are awake. Take your time and take a few normal breaths between deep breaths. 9. The spirometer may include an indicator to show your best effort. Use the indicator as a goal to work toward during each repetition. 10. After each set of 10 deep breaths, practice coughing to be sure your lungs are clear. If you have an incision (the cut made at the time of surgery), support your incision when coughing by placing a pillow or rolled up towels firmly against it. Once you are able to get out of  bed, walk around indoors and cough well. You may stop using the incentive spirometer when instructed by your caregiver.  RISKS AND COMPLICATIONS  Take your time so you do not get dizzy or light-headed.  If you are in pain, you may need to take or ask for pain medication before doing incentive spirometry.  It is harder to take a deep breath if you are having pain. AFTER USE  Rest and breathe slowly and easily.  It can be helpful to keep track of a log of your progress. Your caregiver can provide you with a simple table to help with this. If you are using the spirometer at home, follow these instructions: [place] IF:   You are having difficultly using the spirometer.  You have trouble using the spirometer as often as instructed.  Your pain medication is not giving enough relief while using the spirometer.  You develop fever of 100.5 F (38.1 C) or higher. SEEK IMMEDIATE MEDICAL CARE IF:   You cough up bloody sputum that had not been present before.  You develop fever of 102 F (38.9 C) or greater.  You develop worsening pain at or near the incision site. MAKE SURE YOU:   Understand these instructions.  Will watch your condition.  Will get help right away if you are not doing well or get worse. Document Released: 11/15/2006 Document Revised: 09/27/2011 Document Reviewed: 01/16/2007 ExitCare Patient Information 2014 Exit[employer], [place].   ________________________________________________________________________  WHAT IS A BLOOD TRANSFUSION? Blood Transfusion Information  A transfusion is the replacement of blood or some of its parts. Blood is made up of multiple cells which provide different functions.  Red blood cells carry oxygen and are used for blood loss replacement.  White blood cells fight against infection.  Platelets control bleeding.  Plasma helps clot blood.  Other blood products are available for specialized needs, such as hemophilia or other clotting disorders. BEFORE THE TRANSFUSION  Who gives blood for transfusions?   Healthy volunteers who are fully evaluated to make sure their blood is safe. This is blood bank blood. Transfusion therapy is the safest it has ever been in the practice of medicine. Before blood is taken from a donor, a complete  history is taken to make sure that person has no history of diseases nor engages in risky social behavior (examples are intravenous drug use or sexual activity with multiple partners). The donor's travel history is screened to minimize risk of transmitting infections, such as malaria. The donated blood is tested for signs of infectious diseases, such as HIV and hepatitis. The blood is then tested to be sure it is compatible with you in order to minimize the chance of a transfusion reaction. If you or a relative donates blood, this is often done in anticipation of surgery and is not appropriate for emergency situations. It takes many days to process the donated blood. RISKS AND COMPLICATIONS Although transfusion therapy is very safe and saves many lives, the main dangers of transfusion include:   Getting an infectious disease.  Developing a transfusion reaction. This is an allergic reaction to something in the blood you were given. Every precaution is taken to prevent this. The decision to have a blood transfusion has been considered carefully by your caregiver before blood is given. Blood is not given unless the benefits outweigh the risks. AFTER THE TRANSFUSION  Right after receiving a blood transfusion, you will usually feel much better and more energetic. This is especially true if your red blood  cells have gotten low (anemic). The transfusion raises the level of the red blood cells which carry oxygen, and this usually causes an energy increase.  The nurse administering the transfusion will monitor you carefully for complications. HOME CARE INSTRUCTIONS  No special instructions are needed after a transfusion. You may find your energy is better. Speak with your caregiver about any limitations on activity for underlying diseases you may have. SEEK MEDICAL CARE IF:   Your condition is not improving after your transfusion.  You develop redness or irritation at the intravenous (IV) site. SEEK  IMMEDIATE MEDICAL CARE IF:  Any of the following symptoms occur over the next 12 hours:  Shaking chills.  You have a temperature by mouth above 102 F (38.9 C), not controlled by medicine.  Chest, back, or muscle pain.  People around you feel you are not acting correctly or are confused.  Shortness of breath or difficulty breathing.  Dizziness and fainting.  You get a rash or develop hives.  You have a decrease in urine output.  Your urine turns a dark color or changes to pink, red, or brown. Any of the following symptoms occur over the next 10 days:  You have a temperature by mouth above 102 F (38.9 C), not controlled by medicine.  Shortness of breath.  Weakness after normal activity.  The white part of the eye turns yellow (jaundice).  You have a decrease in the amount of urine or are urinating less often.  Your urine turns a dark color or changes to pink, red, or brown. Document Released: 07/02/2000 Document Revised: 09/27/2011 Document Reviewed: 02/19/2008 [facility] Patient Information 2014 [place], [place].  _______________________________________________________________________

## 2016-12-16 ENCOUNTER — Ambulatory Visit (HOSPITAL_COMMUNITY)
Admission: RE | Admit: 2016-12-16 | Discharge: 2016-12-16 | Disposition: A | Discharge status: Home / Self Care | Payer: 59 | Source: Ambulatory Visit | Attending: Surgical | Admitting: Surgical

## 2016-12-16 ENCOUNTER — Encounter (HOSPITAL_COMMUNITY): Payer: Self-pay

## 2016-12-16 ENCOUNTER — Encounter (HOSPITAL_COMMUNITY)
Admission: RE | Admit: 2016-12-16 | Discharge: 2016-12-16 | Disposition: A | Discharge status: Home / Self Care | Payer: 59 | Source: Ambulatory Visit | Attending: Orthopedic Surgery | Admitting: Orthopedic Surgery

## 2016-12-16 DIAGNOSIS — Z01818 Encounter for other preprocedural examination: Secondary | ICD-10-CM | POA: Diagnosis present

## 2016-12-16 DIAGNOSIS — Z01810 Encounter for preprocedural cardiovascular examination: Secondary | ICD-10-CM | POA: Diagnosis not present

## 2016-12-16 DIAGNOSIS — M1711 Unilateral primary osteoarthritis, right knee: Secondary | ICD-10-CM | POA: Insufficient documentation

## 2016-12-16 DIAGNOSIS — J9811 Atelectasis: Secondary | ICD-10-CM | POA: Diagnosis not present

## 2016-12-16 HISTORY — DX: Nausea with vomiting, unspecified: R11.2

## 2016-12-16 HISTORY — DX: Adverse effect of unspecified anesthetic, initial encounter: T41.45XA

## 2016-12-16 HISTORY — DX: Benign neoplasm of liver: D13.4

## 2016-12-16 HISTORY — DX: Other complications of anesthesia, initial encounter: T88.59XA

## 2016-12-16 HISTORY — DX: Other specified postprocedural states: Z98.890

## 2016-12-16 LAB — COMPREHENSIVE METABOLIC PANEL
ALT: 18 U/L (ref 14–54)
AST: 21 U/L (ref 15–41)
Albumin: 4.4 g/dL (ref 3.5–5.0)
Alkaline Phosphatase: 71 U/L (ref 38–126)
Anion gap: 6 (ref 5–15)
BUN: 14 mg/dL (ref 6–20)
CO2: 29 mmol/L (ref 22–32)
Calcium: 9.5 mg/dL (ref 8.9–10.3)
Chloride: 109 mmol/L (ref 101–111)
Creatinine, Ser: 0.77 mg/dL (ref 0.44–1.00)
GFR calc Af Amer: >60 mL/min (ref >60)
GFR calc non Af Amer: >60 mL/min (ref >60)
Glucose, Bld: 111 mg/dL — ABNORMAL HIGH (ref 65–99)
Potassium: 4.9 mmol/L (ref 3.5–5.1)
Sodium: 144 mmol/L (ref 135–145)
Total Bilirubin: 0.7 mg/dL (ref 0.3–1.2)
Total Protein: 7.4 g/dL (ref 6.5–8.1)

## 2016-12-16 LAB — CBC WITH DIFFERENTIAL/PLATELET
Basophils Absolute: 0 10*3/uL (ref 0.0–0.1)
Basophils Relative: 0 %
Eosinophils Absolute: 0.1 10*3/uL (ref 0.0–0.7)
Eosinophils Relative: 2 %
HCT: 40.1 % (ref 36.0–46.0)
Hemoglobin: 13.4 g/dL (ref 12.0–15.0)
Lymphocytes Relative: 39 %
Lymphs Abs: 2 10*3/uL (ref 0.7–4.0)
MCH: 31.7 pg (ref 26.0–34.0)
MCHC: 33.4 g/dL (ref 30.0–36.0)
MCV: 94.8 fL (ref 78.0–100.0)
Monocytes Absolute: 0.4 10*3/uL (ref 0.1–1.0)
Monocytes Relative: 9 %
Neutro Abs: 2.6 10*3/uL (ref 1.7–7.7)
Neutrophils Relative %: 50 %
Platelets: 260 10*3/uL (ref 150–400)
RBC: 4.23 MIL/uL (ref 3.87–5.11)
RDW: 12.8 % (ref 11.5–15.5)
WBC: 5.2 10*3/uL (ref 4.0–10.5)

## 2016-12-16 LAB — SURGICAL PCR SCREEN
MRSA, PCR: NEGATIVE
STAPHYLOCOCCUS AUREUS: NEGATIVE

## 2016-12-16 LAB — URINALYSIS, ROUTINE W REFLEX MICROSCOPIC
Bilirubin Urine: NEGATIVE
Glucose, UA: NEGATIVE mg/dL
Hgb urine dipstick: NEGATIVE
Ketones, ur: NEGATIVE mg/dL
Leukocytes, UA: NEGATIVE
Nitrite: NEGATIVE
Protein, ur: NEGATIVE mg/dL
Specific Gravity, Urine: 1.018 (ref 1.005–1.030)
pH: 5 (ref 5.0–8.0)

## 2016-12-16 LAB — PROTIME-INR
INR: 0.94
Prothrombin Time: 12.6 seconds (ref 11.4–15.2)

## 2016-12-16 LAB — ABO/RH: ABO/RH(D): A POS

## 2016-12-16 LAB — APTT: aPTT: 23 seconds — ABNORMAL LOW (ref 24–36)

## 2016-12-21 NOTE — H&P (Signed)
TOTAL KNEE ADMISSION H&P  Patient is being admitted for right total knee arthroplasty.  Subjective:  Chief Complaint:right knee pain.  HPI: [name], 61 y.o. female, has a history of pain and functional disability in the right knee due to trauma and arthritis and has failed non-surgical conservative treatments for greater than 12 weeks to includeNSAID's and/or analgesics, corticosteriod injections, viscosupplementation injections, flexibility and strengthening excercises and activity modification.  Onset of symptoms was gradual, starting 3 years ago with gradually worsening course since that time. The patient noted prior procedures on the knee to include  arthroscopy and menisectomy on the right knee(s).  Patient currently rates pain in the right knee(s) at 9 out of 10 with activity. Patient has night pain, worsening of pain with activity and weight bearing, pain that interferes with activities of daily living, pain with passive range of motion, crepitus and joint swelling.  Patient has evidence of periarticular osteophytes and joint space narrowing by imaging studies. . There is no active infection.  Patient Active Problem List   Diagnosis Date Noted  . 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 Biliary dyskinesia 09/08/2011   Past Medical History:  Diagnosis Date  . Abdominal distension   . Abdominal pain   . Arthritis   . Arthritis pain   . Asthma   . Biliary dyskinesia   . Chronic kidney disease    kidney stones  . Complication of anesthesia    Difficulty arousing  . Constipation   . Diarrhea   . GERD (gastroesophageal reflux disease)   . H/O hiatal hernia   . Liver tumor (benign) 05/2016  . PONV (postoperative nausea and vomiting)   . Spondylolisthesis of lumbosacral region   . Trouble swallowing     Past Surgical History:  Procedure Laterality Date  . ABDOMINAL HYSTERECTOMY    . CHOLECYSTECTOMY  10/08/2011   Procedure: LAPAROSCOPIC CHOLECYSTECTOMY WITH INTRAOPERATIVE CHOLANGIOGRAM;   Surgeon: Odis Hollingshead, MD;  Location: [place];  Service: General;  Laterality: N/A;  . KNEE ARTHROSCOPY  03/2016  . TUBAL LIGATION    . VEIN LIGATION AND STRIPPING        Current Outpatient Prescriptions:  .  gabapentin (NEURONTIN) 300 MG capsule, Take 300 mg by mouth daily. , Disp: , Rfl:  .  oxyCODONE-acetaminophen (PERCOCET) 10-325 MG tablet, Take 1 tablet by mouth at bedtime., Disp:  .  pantoprazole (PROTONIX) 40 MG tablet, Take 40 mg by mouth daily., Disp: , Rfl: 5 .  traMADol (ULTRAM) 50 MG tablet, Take 50 mg by mouth 2 (two) times daily as needed (for pain.)., Disp: ,  .  VENTOLIN HFA 108 (90 Base) MCG/ACT inhaler, Inhale 1-2 puffs into the lungs every 6 (six) hours as needed for wheezing or shortness of breath., Disp: , Rfl:   Allergies  Allergen Reactions  . Morphine And Related Other (See Comments)    Low b/p.Karma Greaser...hallucinations  Pt was hospitalized due to reaction.  Marland Kitchen Penicillins Anaphylaxis    Has patient had a PCN reaction causing immediate rash, facial/tongue/throat swelling, SOB or lightheadedness with hypotension: Yes Has patient had a PCN reaction causing severe rash involving mucus membranes or skin necrosis: No Has patient had a PCN reaction that required hospitalization: No Has patient had a PCN reaction occurring within the last 10 years: No If all of the above answers are "NO", then may proceed with Cephalosporin use.   . Latex Itching and Other (See Comments)    Blistering  . Erythromycin Hives and Rash    Social  History  Substance Use Topics  . Smoking status: Former Research scientist (life sciences)  . Smokeless tobacco: Never Used  . Alcohol use 0.6 oz/week    1 Glasses of wine per week     Comment: weekly    Family History  Problem Relation Age of Onset  . Cancer Mother        colon  . Heart disease Father   . Cancer Sister        lung/squamous cell     Review of Systems  Constitutional: Positive for diaphoresis and malaise/fatigue. Negative for chills, fever  and weight loss.  HENT: Negative.   Eyes: Negative.   Respiratory: Negative.   Cardiovascular: Negative.   Gastrointestinal: Negative.   Genitourinary: Negative.   Musculoskeletal: Positive for back pain, joint pain and myalgias. Negative for falls and neck pain.  Skin: Negative.   Neurological: Negative.  Negative for weakness.  Endo/Heme/Allergies: Positive for environmental allergies. Negative for polydipsia. Does not bruise/bleed easily.  Psychiatric/Behavioral: Negative for depression, hallucinations, memory loss, substance abuse and suicidal ideas. The patient is nervous/anxious. The patient does not have insomnia.     Objective:  Physical Exam  Constitutional: She is oriented to person, place, and time. She appears well-developed and well-nourished. No distress.  HENT:  Head: Normocephalic and atraumatic.  Right Ear: External ear normal.  Left Ear: External ear normal.  Nose: Nose normal.  Mouth/Throat: Oropharynx is clear and moist.  Eyes: Conjunctivae and EOM are normal.  Neck: Normal range of motion. Neck supple.  Cardiovascular: Normal rate, regular rhythm, normal heart sounds and intact distal pulses.   No murmur heard. Respiratory: Effort normal and breath sounds normal. No respiratory distress. She has no wheezes.  GI: Soft. Bowel sounds are normal. She exhibits no distension. There is no tenderness.  Musculoskeletal:       Right hip: Normal.       Left hip: Normal.       Right knee: She exhibits decreased range of motion, swelling and effusion. She exhibits no erythema. Tenderness found. Medial joint line and lateral joint line tenderness noted.       Left knee: Normal.  Neurological: She is alert and oriented to person, place, and time. She has normal strength. No sensory deficit.  Skin: No rash noted. She is not diaphoretic. No erythema.  Psychiatric: She has a normal mood and affect. Her behavior is normal.    Vitals  Weight: 165 lb Height: 65in Body  Surface Area: 1.82 m Body Mass Index: 27.46 kg/m  Pulse: 76 (Regular)  BP: 122/76 (Sitting, Left Arm, Standard)  Imaging Review Plain radiographs demonstrate severe degenerative joint disease of the right knee(s). The overall alignment ismild varus. The bone quality appears to be good for age and reported activity level.  Assessment/Plan:  End stage primary osteoarthritis, right knee   The patient history, physical examination, clinical judgment of the provider and imaging studies are consistent with end stage degenerative joint disease of the right knee(s) and total knee arthroplasty is deemed medically necessary. The treatment options including medical management, injection therapy arthroscopy and arthroplasty were discussed at length. The risks and benefits of total knee arthroplasty were presented and reviewed. The risks due to aseptic loosening, infection, stiffness, patella tracking problems, thromboembolic complications and other imponderables were discussed. The patient acknowledged the explanation, agreed to proceed with the plan and consent was signed. Patient is being admitted for inpatient treatment for surgery, pain control, PT, OT, prophylactic antibiotics, VTE prophylaxis, progressive ambulation and ADL's and  discharge planning. The patient is planning to be discharged home with home health services    PCP: Bebe Liter, PA-C GI: Dr. Michail Sermon Therapy Plans: HHPT ([facility]) then outpatient therapy at [facility] with daughter DME: has equipment Other: TXA IV   Ardeen Jourdain, PA-C

## 2016-12-22 ENCOUNTER — Inpatient Hospital Stay (HOSPITAL_COMMUNITY)
Admission: RE | Admit: 2016-12-22 | Discharge: 2016-12-24 | DRG: 470 | Disposition: A | Discharge status: Home Health | Payer: 59 | Source: Ambulatory Visit | Attending: Orthopedic Surgery | Admitting: Orthopedic Surgery

## 2016-12-22 ENCOUNTER — Encounter (HOSPITAL_COMMUNITY)
Admission: RE | Disposition: A | Discharge status: Home Health | Payer: Self-pay | Source: Ambulatory Visit | Attending: Orthopedic Surgery

## 2016-12-22 ENCOUNTER — Inpatient Hospital Stay (HOSPITAL_COMMUNITY): Payer: 59 | Admitting: Registered Nurse

## 2016-12-22 ENCOUNTER — Encounter (HOSPITAL_COMMUNITY): Payer: Self-pay | Admitting: Registered Nurse

## 2016-12-22 DIAGNOSIS — M1711 Unilateral primary osteoarthritis, right knee: Principal | ICD-10-CM | POA: Diagnosis present

## 2016-12-22 DIAGNOSIS — Z96651 Presence of right artificial knee joint: Secondary | ICD-10-CM

## 2016-12-22 DIAGNOSIS — Z880 Allergy status to penicillin: Secondary | ICD-10-CM

## 2016-12-22 DIAGNOSIS — Z87891 Personal history of nicotine dependence: Secondary | ICD-10-CM

## 2016-12-22 DIAGNOSIS — Z888 Allergy status to other drugs, medicaments and biological substances status: Secondary | ICD-10-CM | POA: Diagnosis not present

## 2016-12-22 DIAGNOSIS — K219 Gastro-esophageal reflux disease without esophagitis: Secondary | ICD-10-CM | POA: Diagnosis present

## 2016-12-22 HISTORY — PX: TOTAL KNEE ARTHROPLASTY: SHX125

## 2016-12-22 LAB — TYPE AND SCREEN
ABO/RH(D): A POS
Antibody Screen: NEGATIVE

## 2016-12-22 SURGERY — ARTHROPLASTY, KNEE, TOTAL
Anesthesia: General | Site: Knee | Laterality: Right

## 2016-12-22 MED ORDER — ONDANSETRON HCL 4 MG/2ML IJ SOLN
INTRAMUSCULAR | Status: DC | PRN
  Administered 2016-12-22: 4 mg via INTRAVENOUS

## 2016-12-22 MED ORDER — MIDAZOLAM HCL 2 MG/2ML IJ SOLN
INTRAMUSCULAR | Status: AC
  Filled 2016-12-22: qty 2

## 2016-12-22 MED ORDER — ROCURONIUM BROMIDE 50 MG/5ML IV SOSY
PREFILLED_SYRINGE | INTRAVENOUS | Status: AC
  Filled 2016-12-22: qty 5

## 2016-12-22 MED ORDER — ALBUTEROL SULFATE (2.5 MG/3ML) 0.083% IN NEBU: 2.5000 mg | INHALATION_SOLUTION | Freq: Four times a day (QID) | RESPIRATORY_TRACT | Status: DC | PRN

## 2016-12-22 MED ORDER — OXYCODONE-ACETAMINOPHEN 10-325 MG PO TABS: 1.0000 | ORAL_TABLET | Freq: Every day | ORAL | Status: DC

## 2016-12-22 MED ORDER — FENTANYL CITRATE (PF) 100 MCG/2ML IJ SOLN
25.0000 ug | INTRAMUSCULAR | Status: DC | PRN
  Administered 2016-12-22 (×2): 50 ug via INTRAVENOUS

## 2016-12-22 MED ORDER — FLEET ENEMA 7-19 GM/118ML RE ENEM: 1.0000 | ENEMA | Freq: Once | RECTAL | Status: DC | PRN

## 2016-12-22 MED ORDER — BISACODYL 5 MG PO TBEC: 5.0000 mg | DELAYED_RELEASE_TABLET | Freq: Every day | ORAL | Status: DC | PRN

## 2016-12-22 MED ORDER — POLYETHYLENE GLYCOL 3350 17 G PO PACK: 17.0000 g | PACK | Freq: Every day | ORAL | Status: DC | PRN

## 2016-12-22 MED ORDER — LIDOCAINE 2% (20 MG/ML) 5 ML SYRINGE
INTRAMUSCULAR | Status: DC | PRN
  Administered 2016-12-22: 80 mg via INTRAVENOUS

## 2016-12-22 MED ORDER — SUGAMMADEX SODIUM 200 MG/2ML IV SOLN
INTRAVENOUS | Status: AC
  Filled 2016-12-22: qty 2

## 2016-12-22 MED ORDER — BUPIVACAINE-EPINEPHRINE (PF) 0.5% -1:200000 IJ SOLN
INTRAMUSCULAR | Status: DC | PRN
  Administered 2016-12-22: 30 mL via PERINEURAL

## 2016-12-22 MED ORDER — LACTATED RINGERS IV SOLN
INTRAVENOUS | Status: DC
  Administered 2016-12-22 (×2): via INTRAVENOUS

## 2016-12-22 MED ORDER — MIDAZOLAM HCL 5 MG/5ML IJ SOLN
INTRAMUSCULAR | Status: DC | PRN
  Administered 2016-12-22: 2 mg via INTRAVENOUS

## 2016-12-22 MED ORDER — FENTANYL CITRATE (PF) 100 MCG/2ML IJ SOLN
INTRAMUSCULAR | Status: AC
  Filled 2016-12-22: qty 2

## 2016-12-22 MED ORDER — MIDAZOLAM HCL 2 MG/2ML IJ SOLN
INTRAMUSCULAR | Status: AC
  Administered 2016-12-22: 2 mg
  Filled 2016-12-22: qty 2

## 2016-12-22 MED ORDER — BUPIVACAINE HCL (PF) 0.5 % IJ SOLN
INTRAMUSCULAR | Status: AC
  Filled 2016-12-22: qty 30

## 2016-12-22 MED ORDER — SODIUM CHLORIDE 0.9 % IJ SOLN
INTRAMUSCULAR | Status: AC
  Filled 2016-12-22: qty 20

## 2016-12-22 MED ORDER — ONDANSETRON HCL 4 MG/2ML IJ SOLN
4.0000 mg | Freq: Four times a day (QID) | INTRAMUSCULAR | Status: DC | PRN
  Administered 2016-12-22: 4 mg via INTRAVENOUS
  Filled 2016-12-22: qty 2

## 2016-12-22 MED ORDER — ACETAMINOPHEN 650 MG RE SUPP: 650.0000 mg | Freq: Four times a day (QID) | RECTAL | Status: DC | PRN

## 2016-12-22 MED ORDER — OXYCODONE HCL 5 MG PO TABS: 5.0000 mg | ORAL_TABLET | Freq: Every day | ORAL | Status: DC

## 2016-12-22 MED ORDER — GABAPENTIN 300 MG PO CAPS
300.0000 mg | ORAL_CAPSULE | Freq: Every day | ORAL | Status: DC
  Administered 2016-12-23 – 2016-12-24 (×2): 300 mg via ORAL
  Filled 2016-12-22 (×3): qty 1

## 2016-12-22 MED ORDER — ROCURONIUM BROMIDE 10 MG/ML (PF) SYRINGE
PREFILLED_SYRINGE | INTRAVENOUS | Status: DC | PRN
  Administered 2016-12-22: 20 mg via INTRAVENOUS
  Administered 2016-12-22: 50 mg via INTRAVENOUS

## 2016-12-22 MED ORDER — METOCLOPRAMIDE HCL 5 MG/ML IJ SOLN
INTRAMUSCULAR | Status: AC
  Filled 2016-12-22: qty 2

## 2016-12-22 MED ORDER — HYDROMORPHONE HCL 1 MG/ML IJ SOLN
INTRAMUSCULAR | Status: AC
  Administered 2016-12-22: 0.5 mg
  Filled 2016-12-22: qty 2

## 2016-12-22 MED ORDER — ALUM & MAG HYDROXIDE-SIMETH 200-200-20 MG/5ML PO SUSP: 30.0000 mL | ORAL | Status: DC | PRN

## 2016-12-22 MED ORDER — MIDAZOLAM HCL 5 MG/ML IJ SOLN: 2.0000 mg | Freq: Once | INTRAMUSCULAR | Status: DC

## 2016-12-22 MED ORDER — HYDROCODONE-ACETAMINOPHEN 5-325 MG PO TABS
1.0000 | ORAL_TABLET | ORAL | Status: DC | PRN
  Administered 2016-12-22: 17:00:00 1 via ORAL
  Administered 2016-12-23 (×2): 2 via ORAL
  Administered 2016-12-23: 1 via ORAL
  Administered 2016-12-23 (×2): 2 via ORAL
  Filled 2016-12-22 (×2): qty 2
  Filled 2016-12-22 (×2): qty 1
  Filled 2016-12-22 (×3): qty 2

## 2016-12-22 MED ORDER — BUPIVACAINE LIPOSOME 1.3 % IJ SUSP
20.0000 mL | Freq: Once | INTRAMUSCULAR | Status: DC
  Filled 2016-12-22: qty 20

## 2016-12-22 MED ORDER — SODIUM CHLORIDE 0.9 % IR SOLN
Status: DC | PRN
  Administered 2016-12-22: 1000 mL

## 2016-12-22 MED ORDER — HYDROMORPHONE HCL 1 MG/ML IJ SOLN
0.2500 mg | INTRAMUSCULAR | Status: DC | PRN
  Administered 2016-12-22 (×4): 0.5 mg via INTRAVENOUS

## 2016-12-22 MED ORDER — CHLORHEXIDINE GLUCONATE 4 % EX LIQD: 60.0000 mL | Freq: Once | CUTANEOUS | Status: DC

## 2016-12-22 MED ORDER — SODIUM CHLORIDE 0.9 % IJ SOLN
INTRAMUSCULAR | Status: DC | PRN
  Administered 2016-12-22: 20 mL

## 2016-12-22 MED ORDER — RIVAROXABAN 10 MG PO TABS
10.0000 mg | ORAL_TABLET | Freq: Every day | ORAL | Status: DC
  Administered 2016-12-23 – 2016-12-24 (×2): 10 mg via ORAL
  Filled 2016-12-22 (×2): qty 1

## 2016-12-22 MED ORDER — SCOPOLAMINE 1 MG/3DAYS TD PT72
MEDICATED_PATCH | TRANSDERMAL | Status: AC
  Filled 2016-12-22: qty 1

## 2016-12-22 MED ORDER — VANCOMYCIN HCL IN DEXTROSE 1-5 GM/200ML-% IV SOLN
1000.0000 mg | INTRAVENOUS | Status: AC
  Administered 2016-12-22: 1000 mg via INTRAVENOUS
  Filled 2016-12-22: qty 200

## 2016-12-22 MED ORDER — PHENOL 1.4 % MT LIQD: 1.0000 | OROMUCOSAL | Status: DC | PRN

## 2016-12-22 MED ORDER — FENTANYL CITRATE (PF) 100 MCG/2ML IJ SOLN: 100.0000 ug | Freq: Once | INTRAMUSCULAR | Status: DC

## 2016-12-22 MED ORDER — TRANEXAMIC ACID 1000 MG/10ML IV SOLN
1000.0000 mg | INTRAVENOUS | Status: AC
  Administered 2016-12-22: 1000 mg via INTRAVENOUS
  Filled 2016-12-22: qty 1100

## 2016-12-22 MED ORDER — ONDANSETRON HCL 4 MG PO TABS: 4.0000 mg | ORAL_TABLET | Freq: Four times a day (QID) | ORAL | Status: DC | PRN

## 2016-12-22 MED ORDER — PROPOFOL 10 MG/ML IV BOLUS
INTRAVENOUS | Status: AC
  Filled 2016-12-22: qty 60

## 2016-12-22 MED ORDER — LACTATED RINGERS IV SOLN
INTRAVENOUS | Status: DC
  Administered 2016-12-22: 15:00:00 via INTRAVENOUS

## 2016-12-22 MED ORDER — ONDANSETRON HCL 4 MG/2ML IJ SOLN
INTRAMUSCULAR | Status: AC
  Filled 2016-12-22: qty 2

## 2016-12-22 MED ORDER — HYDROMORPHONE HCL 1 MG/ML IJ SOLN
1.0000 mg | INTRAMUSCULAR | Status: DC | PRN
  Administered 2016-12-23: 1 mg via INTRAVENOUS
  Filled 2016-12-22 (×2): qty 1

## 2016-12-22 MED ORDER — VANCOMYCIN HCL IN DEXTROSE 1-5 GM/200ML-% IV SOLN
1000.0000 mg | Freq: Two times a day (BID) | INTRAVENOUS | Status: AC
  Administered 2016-12-22: 1000 mg via INTRAVENOUS
  Filled 2016-12-22: qty 200

## 2016-12-22 MED ORDER — DEXAMETHASONE SODIUM PHOSPHATE 10 MG/ML IJ SOLN
INTRAMUSCULAR | Status: DC | PRN
Start: 2016-12-22 — End: 2016-12-22
  Administered 2016-12-22: 10 mg via INTRAVENOUS

## 2016-12-22 MED ORDER — BUPIVACAINE LIPOSOME 1.3 % IJ SUSP
INTRAMUSCULAR | Status: DC | PRN
  Administered 2016-12-22: 20 mL

## 2016-12-22 MED ORDER — PROPOFOL 10 MG/ML IV BOLUS
INTRAVENOUS | Status: DC | PRN
  Administered 2016-12-22: 200 mg via INTRAVENOUS

## 2016-12-22 MED ORDER — SODIUM CHLORIDE 0.9 % IR SOLN
Status: DC | PRN
  Administered 2016-12-22: 500 mL

## 2016-12-22 MED ORDER — SODIUM CHLORIDE 0.9 % IR SOLN
Status: AC
  Filled 2016-12-22: qty 500000

## 2016-12-22 MED ORDER — FENTANYL CITRATE (PF) 100 MCG/2ML IJ SOLN
INTRAMUSCULAR | Status: DC | PRN
  Administered 2016-12-22: 25 ug via INTRAVENOUS
  Administered 2016-12-22: 50 ug via INTRAVENOUS
  Administered 2016-12-22: 100 ug via INTRAVENOUS
  Administered 2016-12-22: 25 ug via INTRAVENOUS

## 2016-12-22 MED ORDER — LIDOCAINE 2% (20 MG/ML) 5 ML SYRINGE
INTRAMUSCULAR | Status: AC
  Filled 2016-12-22: qty 5

## 2016-12-22 MED ORDER — METHOCARBAMOL 1000 MG/10ML IJ SOLN
500.0000 mg | Freq: Four times a day (QID) | INTRAVENOUS | Status: DC | PRN
  Administered 2016-12-22: 500 mg via INTRAVENOUS
  Filled 2016-12-22: qty 550

## 2016-12-22 MED ORDER — METOCLOPRAMIDE HCL 5 MG/ML IJ SOLN
10.0000 mg | Freq: Once | INTRAMUSCULAR | Status: AC | PRN
  Administered 2016-12-22: 10 mg via INTRAVENOUS

## 2016-12-22 MED ORDER — FENTANYL CITRATE (PF) 100 MCG/2ML IJ SOLN
INTRAMUSCULAR | Status: AC
  Filled 2016-12-22: qty 4

## 2016-12-22 MED ORDER — OXYCODONE-ACETAMINOPHEN 5-325 MG PO TABS: 1.0000 | ORAL_TABLET | Freq: Every day | ORAL | Status: DC

## 2016-12-22 MED ORDER — PANTOPRAZOLE SODIUM 40 MG PO TBEC
40.0000 mg | DELAYED_RELEASE_TABLET | Freq: Every day | ORAL | Status: DC
  Administered 2016-12-23 – 2016-12-24 (×2): 40 mg via ORAL
  Filled 2016-12-22 (×3): qty 1

## 2016-12-22 MED ORDER — MEPERIDINE HCL 50 MG/ML IJ SOLN: 6.2500 mg | INTRAMUSCULAR | Status: DC | PRN

## 2016-12-22 MED ORDER — SCOPOLAMINE 1 MG/3DAYS TD PT72
MEDICATED_PATCH | TRANSDERMAL | Status: DC | PRN
  Administered 2016-12-22: 1 via TRANSDERMAL

## 2016-12-22 MED ORDER — FENTANYL CITRATE (PF) 100 MCG/2ML IJ SOLN
INTRAMUSCULAR | Status: AC
  Administered 2016-12-22: 100 ug
  Filled 2016-12-22: qty 2

## 2016-12-22 MED ORDER — ACETAMINOPHEN 325 MG PO TABS: 650.0000 mg | ORAL_TABLET | Freq: Four times a day (QID) | ORAL | Status: DC | PRN

## 2016-12-22 MED ORDER — DEXAMETHASONE SODIUM PHOSPHATE 10 MG/ML IJ SOLN
INTRAMUSCULAR | Status: AC
  Filled 2016-12-22: qty 1

## 2016-12-22 MED ORDER — SUGAMMADEX SODIUM 200 MG/2ML IV SOLN
INTRAVENOUS | Status: DC | PRN
Start: 2016-12-22 — End: 2016-12-22
  Administered 2016-12-22: 200 mg via INTRAVENOUS

## 2016-12-22 MED ORDER — METHOCARBAMOL 500 MG PO TABS
500.0000 mg | ORAL_TABLET | Freq: Four times a day (QID) | ORAL | Status: DC | PRN
  Administered 2016-12-23 (×3): 500 mg via ORAL
  Filled 2016-12-22 (×4): qty 1

## 2016-12-22 MED ORDER — MENTHOL 3 MG MT LOZG: 1.0000 | LOZENGE | OROMUCOSAL | Status: DC | PRN

## 2016-12-22 MED ORDER — LACTATED RINGERS IV SOLN: INTRAVENOUS | Status: DC

## 2016-12-22 MED ORDER — METOCLOPRAMIDE HCL 5 MG/ML IJ SOLN
10.0000 mg | Freq: Four times a day (QID) | INTRAMUSCULAR | Status: DC | PRN
  Administered 2016-12-22: 10 mg via INTRAVENOUS
  Filled 2016-12-22: qty 2

## 2016-12-22 MED ORDER — FERROUS SULFATE 325 (65 FE) MG PO TABS
325.0000 mg | ORAL_TABLET | Freq: Three times a day (TID) | ORAL | Status: DC
  Administered 2016-12-23 – 2016-12-24 (×4): 325 mg via ORAL
  Filled 2016-12-22 (×5): qty 1

## 2016-12-22 SURGICAL SUPPLY — 69 items
AGENT HMST SPONGE THK3/8 (HEMOSTASIS) ×1
BAG DECANTER FOR FLEXI CONT (MISCELLANEOUS) IMPLANT
BAG SPEC THK2 15X12 ZIP CLS (MISCELLANEOUS)
BAG ZIPLOCK 12X15 (MISCELLANEOUS) IMPLANT
BANDAGE ACE 4X5 VEL STRL LF (GAUZE/BANDAGES/DRESSINGS) ×3 IMPLANT
BANDAGE ACE 6X5 VEL STRL LF (GAUZE/BANDAGES/DRESSINGS) ×3 IMPLANT
BANDAGE ELASTIC 4 VELCRO ST LF (GAUZE/BANDAGES/DRESSINGS) ×3 IMPLANT
BANDAGE ELASTIC 6 VELCRO ST LF (GAUZE/BANDAGES/DRESSINGS) ×3 IMPLANT
BLADE SAG 18X100X1.27 (BLADE) ×3 IMPLANT
BLADE SAW SGTL 11.0X1.19X90.0M (BLADE) ×3 IMPLANT
BNDG GAUZE ELAST 4 BULKY (GAUZE/BANDAGES/DRESSINGS) ×6 IMPLANT
BONE CEMENT GENTAMICIN (Cement) ×6 IMPLANT
CAP KNEE TOTAL 3 SIGMA ×3 IMPLANT
CEMENT BONE GENTAMICIN 40 (Cement) ×2 IMPLANT
CLOSURE WOUND 1/2 X4 (GAUZE/BANDAGES/DRESSINGS) ×1
COVER SURGICAL LIGHT HANDLE (MISCELLANEOUS) ×3 IMPLANT
CUFF TOURN SGL QUICK 34 (TOURNIQUET CUFF) ×3
CUFF TRNQT CYL 34X4X40X1 (TOURNIQUET CUFF) ×1 IMPLANT
DECANTER SPIKE VIAL GLASS SM (MISCELLANEOUS) ×3 IMPLANT
DRAPE INCISE IOBAN 66X45 STRL (DRAPES) IMPLANT
DRAPE U-SHAPE 47X51 STRL (DRAPES) ×3 IMPLANT
DRSG ADAPTIC 3X8 NADH LF (GAUZE/BANDAGES/DRESSINGS) ×3 IMPLANT
DRSG PAD ABDOMINAL 8X10 ST (GAUZE/BANDAGES/DRESSINGS) ×6 IMPLANT
DURAPREP 26ML APPLICATOR (WOUND CARE) ×3 IMPLANT
ELECT REM PT RETURN 15FT ADLT (MISCELLANEOUS) ×3 IMPLANT
EVACUATOR 1/8 PVC DRAIN (DRAIN) ×3 IMPLANT
FACESHIELD WRAPAROUND (MASK) ×3 IMPLANT
GAUZE SPONGE 4X4 12PLY STRL (GAUZE/BANDAGES/DRESSINGS) ×3 IMPLANT
GLOVE BIOGEL PI IND STRL 6.5 (GLOVE) ×1 IMPLANT
GLOVE BIOGEL PI IND STRL 8.5 (GLOVE) ×1 IMPLANT
GLOVE BIOGEL PI INDICATOR 6.5 (GLOVE) ×2
GLOVE BIOGEL PI INDICATOR 8.5 (GLOVE) ×2
GLOVE ECLIPSE 8.0 STRL XLNG CF (GLOVE) IMPLANT
GLOVE SURG SS PI 6.5 STRL IVOR (GLOVE) ×3 IMPLANT
GLOVE SURG SS PI 8.5 STRL IVOR (GLOVE) ×2
GLOVE SURG SS PI 8.5 STRL STRW (GLOVE) ×1 IMPLANT
GOWN STRL REUS W/TWL LRG LVL3 (GOWN DISPOSABLE) ×3 IMPLANT
GOWN STRL REUS W/TWL XL LVL3 (GOWN DISPOSABLE) ×3 IMPLANT
HANDPIECE INTERPULSE COAX TIP (DISPOSABLE) ×3
HEMOSTAT SPONGE AVITENE ULTRA (HEMOSTASIS) ×3 IMPLANT
IMMOBILIZER KNEE 20 (SOFTGOODS) ×3
IMMOBILIZER KNEE 20 THIGH 36 (SOFTGOODS) ×1 IMPLANT
MANIFOLD NEPTUNE II (INSTRUMENTS) ×3 IMPLANT
NEEDLE HYPO 21X1.5 SAFETY (NEEDLE) ×3 IMPLANT
NEEDLE HYPO 22GX1.5 SAFETY (NEEDLE) ×3 IMPLANT
NS IRRIG 1000ML POUR BTL (IV SOLUTION) IMPLANT
PACK TOTAL KNEE CUSTOM (KITS) ×3 IMPLANT
PAD ABD 8X10 STRL (GAUZE/BANDAGES/DRESSINGS) ×3 IMPLANT
PADDING CAST COTTON 6X4 STRL (CAST SUPPLIES) ×3 IMPLANT
PENCIL SMOKE EVAC W/HOLSTER (ELECTROSURGICAL) ×3 IMPLANT
POSITIONER SURGICAL ARM (MISCELLANEOUS) ×3 IMPLANT
SET HNDPC FAN SPRY TIP SCT (DISPOSABLE) ×1 IMPLANT
SET PAD KNEE POSITIONER (MISCELLANEOUS) ×3 IMPLANT
SPONGE LAP 18X18 X RAY DECT (DISPOSABLE) IMPLANT
STRIP CLOSURE SKIN 1/2X4 (GAUZE/BANDAGES/DRESSINGS) ×2 IMPLANT
SUT BONE WAX W31G (SUTURE) IMPLANT
SUT MNCRL AB 4-0 PS2 18 (SUTURE) ×3 IMPLANT
SUT VIC AB 1 CT1 27 (SUTURE) ×6
SUT VIC AB 1 CT1 27XBRD ANTBC (SUTURE) ×2 IMPLANT
SUT VIC AB 2-0 CT1 27 (SUTURE) ×9
SUT VIC AB 2-0 CT1 TAPERPNT 27 (SUTURE) ×3 IMPLANT
SUT VLOC 180 0 24IN GS25 (SUTURE) ×3 IMPLANT
SYR 20CC LL (SYRINGE) ×6 IMPLANT
TOWER CARTRIDGE SMART MIX (DISPOSABLE) ×3 IMPLANT
TRAY FOLEY BAG SILVER LF 16FR (SET/KITS/TRAYS/PACK) ×3 IMPLANT
TRAY FOLEY W/METER SILVER 16FR (SET/KITS/TRAYS/PACK) IMPLANT
WATER STERILE IRR 1500ML POUR (IV SOLUTION) ×3 IMPLANT
WRAP KNEE MAXI GEL POST OP (GAUZE/BANDAGES/DRESSINGS) ×3 IMPLANT
YANKAUER SUCT BULB TIP 10FT TU (MISCELLANEOUS) ×3 IMPLANT

## 2016-12-22 NOTE — Brief Op Note (Signed)
12/22/2016  1:01 PM  PATIENT:  [name]  61 y.o. female  PRE-OPERATIVE DIAGNOSIS:  right knee Primary  osteoarthritis with flexion contractures.  POST-OPERATIVE DIAGNOSIS:  right knee Primary  osteoarthritis with Flexion Contractures,  PROCEDURE:  Procedure(s): RIGHT TOTAL KNEE ARTHROPLASTY (Right)  SURGEON:  Surgeon(s) and Role:    Latanya Maudlin, MD - Primary  PHYSICIAN ASSISTANT: Ardeen Jourdain PA  ASSISTANTS: Ardeen Jourdain PA  ANESTHESIA:   general  And ADDUCTOR Block EBL:  Total I/O In: 1000 [I.V.:1000] Out: 275 [Urine:250; Blood:25]  BLOOD ADMINISTERED:none  DRAINS: (one) Hemovact drain(s) in the Right Knee with  Suction Open   LOCAL MEDICATIONS USED:  OTHER 20cc of Exparel mixed with 20cc of Normal Saline  SPECIMEN:  No Specimen  DISPOSITION OF SPECIMEN:  N/A  COUNTS:  YES  TOURNIQUET:   Total Tourniquet Time Documented: Thigh (Right) - 63 minutes Total: Thigh (Right) - 63 minutes   DICTATION: .Other Dictation: Dictation Number [id number]  PLAN OF CARE: Admit to inpatient   PATIENT DISPOSITION:  Stable in OR   Delay start of Pharmacological VTE agent (>24hrs) due to surgical blood loss or risk of bleeding: yes

## 2016-12-22 NOTE — Anesthesia Procedure Notes (Addendum)
Procedure Name: Intubation Date/Time: 12/22/2016 11:30 AM Performed by: Talbot Grumbling Pre-anesthesia Checklist: Patient identified, Emergency Drugs available, Suction available and Patient being monitored Patient Re-evaluated:Patient Re-evaluated prior to inductionOxygen Delivery Method: Circle system utilized Preoxygenation: Pre-oxygenation with 100% oxygen Intubation Type: IV induction Ventilation: Mask ventilation without difficulty Laryngoscope Size: Mac and 3 Grade View: Grade I Tube type: Oral Tube size: 7.5 mm Number of attempts: 1 Airway Equipment and Method: Stylet Placement Confirmation: ETT inserted through vocal cords under direct vision,  positive ETCO2 and breath sounds checked- equal and bilateral Secured at: 21 cm Tube secured with: Tape Dental Injury: Teeth and Oropharynx as per pre-operative assessment

## 2016-12-22 NOTE — Transfer of Care (Signed)
Immediate Anesthesia Transfer of Care Note  Patient: [name]  Procedure(s) Performed: Procedure(s): RIGHT TOTAL KNEE ARTHROPLASTY (Right)  Patient Location: PACU  Anesthesia Type:General  Level of Consciousness:  sedated, patient cooperative and responds to stimulation  Airway & Oxygen Therapy:Patient Spontanous Breathing and Patient connected to face mask oxgen  Post-op Assessment:  Report given to PACU RN and Post -op Vital signs reviewed and stable  Post vital signs:  Reviewed and stable  Last Vitals:  Vitals:   12/22/16 1112 12/22/16 1115  BP: 126/75 120/68  Pulse:    Resp:    Temp:      Complications: No apparent anesthesia complications

## 2016-12-22 NOTE — Op Note (Signed)
[name], [name] NO.:  [id number]  MEDICAL RECORD NO.:  [id number]  LOCATION:                                 FACILITY:  PHYSICIAN:  Kipp Brood. Jenevieve Kirschbaum, M.D.DATE OF BIRTH:  Mar 08, 1956  DATE OF PROCEDURE:  12/22/2016 DATE OF DISCHARGE:                              OPERATIVE REPORT   SURGEON:  Kipp Brood. Gladstone Lighter, M.D.  OPERATIVE ASSISTANT:  Ardeen Jourdain, PA  PREOPERATIVE DIAGNOSES: 1. Primary osteoarthritis with bone-on-bone, right knee. 2. Flexion contracture, right knee.  POSTOPERATIVE DIAGNOSES: 1. Primary osteoarthritis with bone-on-bone, right knee. 2. Flexion contracture, right knee.  OPERATION:  Right total knee arthroplasty utilizing the DePuy system, all 3 components were cemented and gentamicin was used in the cement. The sizes used were a size 4 narrow femoral component posterior cruciate sacrificing type, the tibial tray was a size 4, the insert was a size 4, 10 mm thickness, the patella was a size 35.  DESCRIPTION OF PROCEDURE:  Under general anesthesia, routine orthopedic prepping and draping of the right lower extremity was carried out.  The appropriate time-out was first carried out, also marked the appropriate right leg in the holding area.  The patient had 2 g of IV Ancef.  She had 1 g of vancomycin.  The leg was exsanguinated with Esmarch.  The tourniquet was elevated at 325 mmHg.  At this particular time, the leg was placed in the [facility] knee holder.  The knee was flexed and the anterior approach to the knee was carried out.  Two flaps were created. I then carried out a median parapatellar incision.  Patella was reflected laterally.  I then went down and did a medial and lateral meniscectomy and excised the anterior and posterior cruciate ligaments. The initial drill hole then was made in the intercondylar notch.  The canal was easily approached with the canal finder.  I then thoroughly irrigated out the canal and removed 11 mm  thickness off the distal femur.  At that time, femur was measured to be a size 4.  We did anterior, posterior, and chamfering cuts for a size 4 right femoral component.  Attention then was directed to the tibia.  The initial drill hole was made in the tibial plateau.  The guide rod was inserted.  Canal finder was inserted.  We then thoroughly irrigated out the canal.  I then removed 6 mm thickness off the medial affected side.  Note, the knee was quite tight.  At this point, we then inserted our lamina spreaders, removed the posterior spurs and completed our meniscectomies posteriorly.  Great care was taken not to injure any underlying vascular structures.  Following that, I then inserted my spacer blocks to make sure we had good motion.  A good stable fit with size 10 we did. Following that, we then continued to prepare the tibia.  We made our keel cut in the tibia in usual fashion followed by the notch cut on the distal femur.  The trial components were inserted.  We then did a resurfacing procedure on the patella.  Three drill holes were made in the patella in usual fashion.  Following that,  we then removed all trial components, water picked the knee out, cemented all 3 components in simultaneously.  After the cement was hardened, we removed all loose pieces of cement.  I then water picked the knee out again to make sure there were no loose fragments behind.  At this point, we then went on and went through the trial with a 10 mm thickness.  Again, we had a good fit.  I then inserted my permanent rotating platform tray which is the size 10 mm thickness size 4, reduced the knee and had an excellent fit. Note following that, the Hemovac drain was inserted. The knee was closed in layers in usual fashion.  We injected 20 mL of Exparel, 20 mL of saline at the end of the procedure.  The sterile dressings were applied.  Postop, the patient will be on Xarelto followed by aspirin as an  anticoagulant.  The patient will be admitted.          ______________________________ Kipp Brood Gladstone Lighter, M.D.     RAG/MEDQ  D:  12/22/2016  T:  12/22/2016  Job:  [id number]

## 2016-12-22 NOTE — Anesthesia Postprocedure Evaluation (Signed)
Anesthesia Post Note  Patient: [name]  Procedure(s) Performed: Procedure(s) (LRB): RIGHT TOTAL KNEE ARTHROPLASTY (Right)     Patient location during evaluation: PACU Anesthesia Type: Spinal Level of consciousness: awake and alert Pain management: pain level controlled Vital Signs Assessment: post-procedure vital signs reviewed and stable Respiratory status: spontaneous breathing, nonlabored ventilation, respiratory function stable and patient connected to nasal cannula oxygen Cardiovascular status: blood pressure returned to baseline and stable Postop Assessment: no signs of nausea or vomiting and spinal receding Anesthetic complications: no    Last Vitals:  Vitals:   12/22/16 1415 12/22/16 1430  BP: 131/83 133/73  Pulse: 80 80  Resp: 14 14  Temp: 36.9 C 36.6 C    Last Pain:  Vitals:   12/22/16 1415  TempSrc:   PainSc: 5                  Montez Hageman

## 2016-12-22 NOTE — Interval H&P Note (Signed)
History and Physical Interval Note:  12/22/2016 10:41 AM  [name]  has presented today for surgery, with the diagnosis of right knee osteoarthritis   The various methods of treatment have been discussed with the patient and family. After consideration of risks, benefits and other options for treatment, the patient has consented to  Procedure(s): RIGHT TOTAL KNEE ARTHROPLASTY (Right) as a surgical intervention .  The patient's history has been reviewed, patient examined, no change in status, stable for surgery.  I have reviewed the patient's chart and labs.  Questions were answered to the patient's satisfaction.     Elizabeth Paulsen A

## 2016-12-22 NOTE — Progress Notes (Signed)
12/22/16 2000 Nursing Matt Babish PA called reg patient's c/o of nausea despite Zofran given earlier. Order received for iv reglan. Will  Continue to monitor patient.

## 2016-12-22 NOTE — Anesthesia Procedure Notes (Signed)
Anesthesia Regional Block: Adductor canal block   Pre-Anesthetic Checklist: ,, timeout performed, Correct Patient, Correct Site, Correct Laterality, Correct Procedure, Correct Position, site marked, Risks and benefits discussed,  Surgical consent,  Pre-op evaluation,  At surgeon's request and post-op pain management  Laterality: Right and Lower  Prep: Maximum Sterile Barrier Precautions used, chloraprep       Needles:  Injection technique: Single-shot  Needle Type: Echogenic Stimulator Needle     Needle Length: 10cm      Additional Needles:   Procedures: ultrasound guided,,,,,,,,  Narrative:  Start time: 12/22/2016 11:08 AM End time: 12/22/2016 11:18 AM Injection made incrementally with aspirations every 5 mL.  Performed by: Personally  Anesthesiologist: Montez Hageman  Additional Notes: Risks, benefits and alternative to block explained extensively.  Patient tolerated procedure well, without complications.

## 2016-12-22 NOTE — H&P (Signed)
CSW consulted for SNF placement. H&P indicates pt is planning to return home with home health services. RNCM will assist with d/c planning. CSW will remain available to assist with dc planning if plan changes and SNF is required.  Werner Lean LCSW [phone]

## 2016-12-22 NOTE — Progress Notes (Signed)
AssistedDr. Carignan with right, ultrasound guided, adductor canal block. Side rails up, monitors on throughout procedure. See vital signs in flow sheet. Tolerated Procedure well.  

## 2016-12-22 NOTE — Anesthesia Preprocedure Evaluation (Signed)
Anesthesia Evaluation  Patient identified by MRN, date of birth, ID band Patient awake    Reviewed: Allergy & Precautions, NPO status , Patient's Chart, lab work & pertinent test results  History of Anesthesia Complications (+) PONV  Airway Mallampati: II  TM Distance: >3 FB Neck ROM: Full    Dental no notable dental hx.    Pulmonary neg pulmonary ROS, former smoker,    Pulmonary exam normal breath sounds clear to auscultation       Cardiovascular negative cardio ROS Normal cardiovascular exam Rhythm:Regular Rate:Normal     Neuro/Psych negative neurological ROS  negative psych ROS   GI/Hepatic negative GI ROS, Neg liver ROS, hiatal hernia, GERD  ,  Endo/Other  negative endocrine ROS  Renal/GU negative Renal ROS  negative genitourinary   Musculoskeletal negative musculoskeletal ROS (+)   Abdominal   Peds negative pediatric ROS (+)  Hematology negative hematology ROS (+)   Anesthesia Other Findings   Reproductive/Obstetrics negative OB ROS                             Anesthesia Physical Anesthesia Plan  ASA: II  Anesthesia Plan: General   Post-op Pain Management:  Regional for Post-op pain and GA combined w/ Regional for post-op pain   Induction: Intravenous  PONV Risk Score and Plan: 4 or greater and Ondansetron, Dexamethasone, Propofol, Midazolam, Scopolamine patch - Pre-op and Treatment may vary due to age  Airway Management Planned: Oral ETT  Additional Equipment:   Intra-op Plan:   Post-operative Plan: Extubation in OR  Informed Consent: I have reviewed the patients History and Physical, chart, labs and discussed the procedure including the risks, benefits and alternatives for the proposed anesthesia with the patient or authorized representative who has indicated his/her understanding and acceptance.   Dental advisory given  Plan Discussed with: CRNA  Anesthesia  Plan Comments: (Sugamadex. Severe PONV)        Anesthesia Quick Evaluation

## 2016-12-23 ENCOUNTER — Encounter (HOSPITAL_COMMUNITY): Payer: Self-pay | Admitting: Orthopedic Surgery

## 2016-12-23 DIAGNOSIS — Z96651 Presence of right artificial knee joint: Secondary | ICD-10-CM

## 2016-12-23 DIAGNOSIS — M1711 Unilateral primary osteoarthritis, right knee: Secondary | ICD-10-CM | POA: Diagnosis present

## 2016-12-23 LAB — BASIC METABOLIC PANEL
Anion gap: 5 (ref 5–15)
BUN: 9 mg/dL (ref 6–20)
CHLORIDE: 107 mmol/L (ref 101–111)
CO2: 28 mmol/L (ref 22–32)
CREATININE: 0.67 mg/dL (ref 0.44–1.00)
Calcium: 8.7 mg/dL — ABNORMAL LOW (ref 8.9–10.3)
GFR calc Af Amer: >60 mL/min (ref >60)
GFR calc non Af Amer: >60 mL/min (ref >60)
GLUCOSE: 126 mg/dL — AB (ref 65–99)
Potassium: 4.2 mmol/L (ref 3.5–5.1)
Sodium: 140 mmol/L (ref 135–145)

## 2016-12-23 LAB — CBC
HEMATOCRIT: 34.1 % — AB (ref 36.0–46.0)
Hemoglobin: 11.5 g/dL — ABNORMAL LOW (ref 12.0–15.0)
MCH: 31.6 pg (ref 26.0–34.0)
MCHC: 33.7 g/dL (ref 30.0–36.0)
MCV: 93.7 fL (ref 78.0–100.0)
Platelets: 255 10*3/uL (ref 150–400)
RBC: 3.64 MIL/uL — AB (ref 3.87–5.11)
RDW: 12.8 % (ref 11.5–15.5)
WBC: 10.8 10*3/uL — ABNORMAL HIGH (ref 4.0–10.5)

## 2016-12-23 MED ORDER — METOCLOPRAMIDE HCL 10 MG PO TABS: 10.0000 mg | ORAL_TABLET | Freq: Three times a day (TID) | ORAL | 0 refills | Status: DC | PRN

## 2016-12-23 MED ORDER — HYDROCODONE-ACETAMINOPHEN 7.5-325 MG PO TABS
1.0000 | ORAL_TABLET | ORAL | Status: DC | PRN
  Administered 2016-12-24 (×3): 2 via ORAL
  Filled 2016-12-23 (×3): qty 2

## 2016-12-23 MED ORDER — ASPIRIN EC 325 MG PO TBEC: 325.0000 mg | DELAYED_RELEASE_TABLET | Freq: Two times a day (BID) | ORAL | 0 refills | Status: DC

## 2016-12-23 MED ORDER — CYCLOBENZAPRINE HCL 10 MG PO TABS
10.0000 mg | ORAL_TABLET | Freq: Three times a day (TID) | ORAL | Status: DC | PRN
  Administered 2016-12-24: 10 mg via ORAL
  Filled 2016-12-23: qty 1

## 2016-12-23 MED ORDER — METHOCARBAMOL 500 MG PO TABS: 500.0000 mg | ORAL_TABLET | Freq: Four times a day (QID) | ORAL | 0 refills | Status: DC | PRN

## 2016-12-23 MED ORDER — METOCLOPRAMIDE HCL 10 MG PO TABS: 10.0000 mg | ORAL_TABLET | Freq: Three times a day (TID) | ORAL | Status: DC | PRN

## 2016-12-23 MED ORDER — HYDROCODONE-ACETAMINOPHEN 5-325 MG PO TABS: 1.0000 | ORAL_TABLET | ORAL | 0 refills | Status: DC | PRN

## 2016-12-23 MED ORDER — OXYCODONE HCL 5 MG PO TABS: 5.0000 mg | ORAL_TABLET | ORAL | 0 refills | Status: DC | PRN

## 2016-12-23 NOTE — Progress Notes (Signed)
Physical Therapy Treatment Patient Details Name: [name] MRN: [id number] DOB: [birth year] Today's Date: 12/23/2016    History of Present Illness s/p R TKA    PT Comments    The patient is progressing well. Plans DC tomorrow.  Follow Up Recommendations  Home health PT;Supervision/Assistance - 24 hour     Equipment Recommendations  None recommended by PT    Recommendations for Other Services       Precautions / Restrictions Precautions Precautions: Knee;Fall Required Braces or Orthoses: Knee Immobilizer - Right Knee Immobilizer - Right: Discontinue once straight leg raise with < 10 degree lag    Mobility  Bed Mobility Overal bed mobility: Needs Assistance Bed Mobility: Supine to Sit;Sit to Supine     Supine to sit: Min guard Sit to supine: Min guard   General bed mobility comments: self assisted rt leg onto and off ofwith sheet around the foot  Transfers Overall transfer level: Needs assistance Equipment used: Rolling walker (2 wheeled) Transfers: Sit to/from Stand Sit to Stand: Min guard         General transfer comment: for safety  Ambulation/Gait Ambulation/Gait assistance: Min guard Ambulation Distance (Feet): 110 Feet Assistive device: Rolling walker (2 wheeled) Gait Pattern/deviations: Step-to pattern;Step-through pattern;Antalgic     General Gait Details: cues for sequence   Stairs            Wheelchair Mobility    Modified Rankin (Stroke Patients Only)       Balance                                            Cognition Arousal/Alertness: Awake/alert                                            Exercises Total Joint Exercises    General Comments        Pertinent Vitals/Pain Faces Pain Scale: Hurts even more Pain Location: R knee Pain Descriptors / Indicators: Aching Pain Intervention(s): Patient requesting pain meds-RN notified;Limited activity within patient's tolerance    Home  Living Family/patient expects to be discharged to:: Private residence Living Arrangements: Alone Available Help at Discharge: Family Type of Home: House Home Access: Stairs to enter   Home Layout: Two level Home Equipment: Shower seat - built in Additional Comments: will stay on main level for a couple of days    Prior Function Level of Independence: Independent      Comments: works as a Engineer, water   PT Goals (current goals can now be found in the care plan section) Acute Rehab PT Goals Patient Stated Goal: return to independence PT Goal Formulation: With patient Time For Goal Achievement: 12/30/16 Potential to Achieve Goals: Good Progress towards PT goals: Progressing toward goals    Frequency    7X/week      PT Plan Current plan remains appropriate    Co-evaluation              AM-PAC PT "6 Clicks" Daily Activity  Outcome Measure  Difficulty turning over in bed (including adjusting bedclothes, sheets and blankets)?: A Little Difficulty moving from lying on back to sitting on the side of the bed? : A Little Difficulty sitting down on and standing up from a chair with arms (e.g., wheelchair, bedside  commode, etc,.)?: A Little Help needed moving to and from a bed to chair (including a wheelchair)?: A Little Help needed walking in hospital room?: A Little Help needed climbing 3-5 steps with a railing? : A Little 6 Click Score: 18    End of Session Equipment Utilized During Treatment: Right knee immobilizer Activity Tolerance: Patient tolerated treatment well Patient left: in bed Nurse Communication: Mobility status PT Visit Diagnosis: Pain;Difficulty in walking, not elsewhere classified (R26.2) Pain - part of body: Knee     Time: 1426-1450 PT Time Calculation (min) (ACUTE ONLY): 24 min  Charges:  1 gait                  G CodesTresa Endo PT [phone]    [name] 12/23/2016, 3:14 PM

## 2016-12-23 NOTE — Discharge Instructions (Addendum)

## 2016-12-23 NOTE — Progress Notes (Signed)
12/23/16 2345 Nursing Dr Stann Mainland notified of patient's c/o of pain despite prescribed medication. Orders received to increase Norco and try Flexeril. Will continue to monitor patient.

## 2016-12-23 NOTE — Evaluation (Signed)
Physical Therapy Evaluation Patient Details Name: [name] MRN: [id number] DOB: [birth year] Today's Date: 12/23/2016   History of Present Illness  s/p R TKA  Clinical Impression  The patient is progressing well. Plans DC home with family caregivers. Pt admitted with above diagnosis. Pt currently with functional limitations due to the deficits listed below (see PT Problem List).  Pt will benefit from skilled PT to increase their independence and safety with mobility to allow discharge to the venue listed below.       Follow Up Recommendations Home health PT;Supervision/Assistance - 24 hour    Equipment Recommendations  None recommended by PT    Recommendations for Other Services       Precautions / Restrictions Precautions Precautions: Knee;Fall Required Braces or Orthoses: Knee Immobilizer - Right Knee Immobilizer - Right: Discontinue once straight leg raise with < 10 degree lag Restrictions Weight Bearing Restrictions: No      Mobility  Bed Mobility Overal bed mobility: Independent             General bed mobility comments: OOB  Transfers Overall transfer level: Needs assistance Equipment used: Rolling walker (2 wheeled) Transfers: Sit to/from Stand Sit to Stand: Min guard         General transfer comment: for safety  Ambulation/Gait Ambulation/Gait assistance: Min guard Ambulation Distance (Feet): 100 Feet Assistive device: Rolling walker (2 wheeled) Gait Pattern/deviations: Step-to pattern;Step-through pattern;Antalgic        Stairs            Wheelchair Mobility    Modified Rankin (Stroke Patients Only)       Balance                                             Pertinent Vitals/Pain Pain Assessment: Faces Faces Pain Scale: Hurts even more Pain Location: R knee Pain Descriptors / Indicators: Aching Pain Intervention(s): Patient requesting pain meds-RN notified;Limited activity within patient's tolerance     Home Living Family/patient expects to be discharged to:: Private residence Living Arrangements: Alone Available Help at Discharge: Family Type of Home: House Home Access: Stairs to enter   Technical brewer of Steps: 1 Home Layout: Two level Home Equipment: Shower seat - built in Additional Comments: will stay on main level for a couple of days    Prior Function Level of Independence: Independent         Comments: works as a Environmental manager        Extremity/Trunk Assessment   Upper Extremity Assessment Upper Extremity Assessment: Defer to OT evaluation    Lower Extremity Assessment Lower Extremity Assessment: RLE deficits/detail RLE Deficits / Details: knee flexion 10-50    Cervical / Trunk Assessment Cervical / Trunk Assessment: Normal  Communication   Communication: No difficulties  Cognition Arousal/Alertness: Awake/alert Behavior During Therapy: WFL for tasks assessed/performed Overall Cognitive Status: Within Functional Limits for tasks assessed                                        General Comments      Exercises Total Joint Exercises Ankle Circles/Pumps: AROM;Both;10 reps Quad Sets: AROM;Both;10 reps Heel Slides: AAROM;Right;10 reps Hip ABduction/ADduction: AAROM;Right;10 reps Straight Leg Raises: AAROM;Right;10 reps Long Arc Quad: AAROM;Right;10 reps   Assessment/Plan  PT Assessment Patient needs continued PT services  PT Problem List Decreased strength;Decreased range of motion;Decreased activity tolerance;Decreased balance;Decreased mobility;Decreased knowledge of precautions;Decreased safety awareness;Decreased knowledge of use of DME;Pain       PT Treatment Interventions DME instruction;Gait training;Stair training;Functional mobility training;Therapeutic activities;Therapeutic exercise;Patient/family education    PT Goals (Current goals can be found in the Care Plan section)  Acute Rehab PT  Goals Patient Stated Goal: return to independence PT Goal Formulation: With patient Time For Goal Achievement: 12/30/16 Potential to Achieve Goals: Good    Frequency 7X/week   Barriers to discharge        Co-evaluation               AM-PAC PT "6 Clicks" Daily Activity  Outcome Measure Difficulty turning over in bed (including adjusting bedclothes, sheets and blankets)?: A Little Difficulty moving from lying on back to sitting on the side of the bed? : A Little Difficulty sitting down on and standing up from a chair with arms (e.g., wheelchair, bedside commode, etc,.)?: A Little Help needed moving to and from a bed to chair (including a wheelchair)?: A Little Help needed walking in hospital room?: A Little Help needed climbing 3-5 steps with a railing? : A Lot 6 Click Score: 17    End of Session Equipment Utilized During Treatment: Right knee immobilizer Activity Tolerance: Patient tolerated treatment well Patient left: in bed Nurse Communication: Mobility status PT Visit Diagnosis: Pain;Difficulty in walking, not elsewhere classified (R26.2) Pain - part of body: Knee    Time: 1001-1109 PT Time Calculation (min) (ACUTE ONLY): 68 min   Charges:   PT Evaluation $PT Eval Low Complexity: 1 Procedure PT Treatments $Gait Training: 23-37 mins $Therapeutic Exercise: 8-22 mins $Self Care/Home Management: 8-22   PT G CodesTresa Endo PT [phone]   [name] 12/23/2016, 1:33 PM

## 2016-12-23 NOTE — Evaluation (Signed)
Occupational Therapy Evaluation Patient Details Name: [name] MRN: [id number] DOB: [birth year] Today's Date: 12/23/2016    History of Present Illness s/p R TKA   Clinical Impression   This 61 year old female was admitted for the above sx. Will follow in acute setting with mod I goals.  Pt was independent prior to admission and she currently needs min guard to min A for adls and transfers    Follow Up Recommendations  No OT follow up    Equipment Recommendations   (pt undecided about 3:1 commode)    Recommendations for Other Services       Precautions / Restrictions Precautions Precautions: Knee;Fall Required Braces or Orthoses: Knee Immobilizer - Right Restrictions Weight Bearing Restrictions: No      Mobility Bed Mobility Overal bed mobility: Needs Assistance             General bed mobility comments: ppb  Transfers Overall transfer level: Needs assistance Equipment used: Rolling walker (2 wheeled) Transfers: Sit to/from Stand Sit to Stand: Min guard         General transfer comment: for safety    Balance                                           ADL either performed or assessed with clinical judgement   ADL Overall ADL's : Needs assistance/impaired Eating/Feeding: Independent   Grooming: Wash/dry hands;Supervision/safety;Standing   Upper Body Bathing: Set up;Sitting   Lower Body Bathing: Minimal assistance;Sit to/from stand   Upper Body Dressing : Set up;Sitting   Lower Body Dressing: Minimal assistance;Sit to/from stand   Toilet Transfer: Min guard;BSC;Comfort height toilet;RW;Ambulation   Toileting- Water quality scientist and Hygiene: Min guard;Sit to/from stand         General ADL Comments: pt has a standard commode. She is going to think about whether or not she wants 3:1 commode. This would be easier for her.  When OT arrived, pt had legrest partially down; educated that this needs to be up completely and that  knee must be kept straight when not exercising     Vision         Perception     Praxis      Pertinent Vitals/Pain Pain Assessment: Faces Faces Pain Scale: Hurts even more Pain Location: R knee Pain Descriptors / Indicators: Aching Pain Intervention(s): Limited activity within patient's tolerance;Monitored during session;Premedicated before session;Repositioned;Heat applied     Hand Dominance     Extremity/Trunk Assessment Upper Extremity Assessment Upper Extremity Assessment: Overall WFL for tasks assessed           Communication Communication Communication: No difficulties   Cognition Arousal/Alertness: Awake/alert Behavior During Therapy: WFL for tasks assessed/performed Overall Cognitive Status: Within Functional Limits for tasks assessed                                     General Comments       Exercises     Shoulder Instructions      Home Living Family/patient expects to be discharged to:: Private residence Living Arrangements: Alone Available Help at Discharge: Family         Home Layout: Two level     Bathroom Shower/Tub: Gaffer (upstairs)   Bathroom Toilet: Standard (vanity next to commode)     Home  Equipment: Shower seat - built in   Additional Comments: will stay on main level for a couple of days      Prior Functioning/Environment Level of Independence: Independent        Comments: works as a Programmer, applications Problem List: Pain;Decreased knowledge of use of DME or AE      OT Treatment/Interventions: Self-care/ADL training;DME and/or AE instruction;Patient/family education    OT Goals(Current goals can be found in the care plan section) Acute Rehab OT Goals Patient Stated Goal: return to independence OT Goal Formulation: With patient Time For Goal Achievement: 12/30/16 Potential to Achieve Goals: Good ADL Goals Pt Will Transfer to Toilet: with mod assist;bedside commode;regular height toilet  (vs) Additional ADL Goal #1: pt will gather clothes at mod I level  OT Frequency: Min 2X/week   Barriers to D/C:            Co-evaluation              AM-PAC PT "6 Clicks" Daily Activity     Outcome Measure Help from another person eating meals?: None Help from another person taking care of personal grooming?: A Little Help from another person toileting, which includes using toliet, bedpan, or urinal?: A Little Help from another person bathing (including washing, rinsing, drying)?: A Little Help from another person to put on and taking off regular upper body clothing?: A Little Help from another person to put on and taking off regular lower body clothing?: A Little 6 Click Score: 19   End of Session    Activity Tolerance: Patient tolerated treatment well Patient left: in chair;with call bell/phone within reach  OT Visit Diagnosis: Pain Pain - Right/Left: Right Pain - part of body: Knee                Time: 1165-7903 OT Time Calculation (min): 30 min Charges:  OT General Charges $OT Visit: 1 Procedure OT Evaluation $OT Eval Low Complexity: 1 Procedure OT Treatments $Self Care/Home Management : 8-22 mins G-Codes:     [place], OTR/L [phone] 12/23/2016  Mung Rinker 12/23/2016, 10:08 AM

## 2016-12-23 NOTE — Progress Notes (Signed)
   Subjective: 1 Day Post-Op Procedure(s) (LRB): RIGHT TOTAL KNEE ARTHROPLASTY (Right) Patient reports pain as mild.   Patient seen in rounds with Dr. Gladstone Lighter. Patient is well, and has had no acute complaints or problems. She has seen improvement in her nausea with change to Reglan. Reports that foley catheter was just removed. No SOB or chest pain.  We will start therapy today.    Objective: Vital signs in last 24 hours: Temp:  [97.5 F (36.4 C)-98.6 F (37 C)] 98.1 F (36.7 C) (06/07 0646) Pulse Rate:  [66-88] 66 (06/07 0646) Resp:  [13-21] 16 (06/07 0646) BP: (101-148)/(58-94) 121/64 (06/07 0646) SpO2:  [94 %-100 %] 99 % (06/07 0646) Weight:  [81.4 kg (179 lb 8 oz)] 81.4 kg (179 lb 8 oz) (06/06 0954)  Intake/Output from previous day:  Intake/Output Summary (Last 24 hours) at 12/23/16 0717 Last data filed at 12/23/16 0600  Gross per 24 hour  Intake             4180 ml  Output             2402 ml  Net             1778 ml     Labs:  Recent Labs  12/23/16 0430  HGB 11.5*    Recent Labs  12/23/16 0430  WBC 10.8*  RBC 3.64*  HCT 34.1*  PLT 255    Recent Labs  12/23/16 0430  NA 140  K 4.2  CL 107  CO2 28  BUN 9  CREATININE 0.67  GLUCOSE 126*  CALCIUM 8.7*    EXAM General - Patient is Alert and Oriented Extremity - Neurologically intact Sensation intact distally Intact pulses distally Dorsiflexion/Plantar flexion intact No cellulitis present Compartment soft Dressing - dressing C/D/I Motor Function - intact, moving foot and toes well on exam.  Hemovac pulled without difficulty.  Past Medical History:  Diagnosis Date  . Abdominal distension   . Abdominal pain   . Arthritis   . Arthritis pain   . Asthma   . Biliary dyskinesia   . Chronic kidney disease    kidney stones  . Complication of anesthesia    Difficulty arousing  . Constipation   . Diarrhea   . GERD (gastroesophageal reflux disease)   . H/O hiatal hernia   . Liver tumor  (benign) 05/2016  . PONV (postoperative nausea and vomiting)   . Spondylolisthesis of lumbosacral region   . Trouble swallowing     Assessment/Plan: 1 Day Post-Op Procedure(s) (LRB): RIGHT TOTAL KNEE ARTHROPLASTY (Right) Active Problems:   Primary osteoarthritis of right knee   S/P total knee arthroplasty, right  Estimated body mass index is 29.87 kg/m as calculated from the following:   Height as of this encounter: 5\' 5"  (1.651 m).   Weight as of this encounter: 81.4 kg (179 lb 8 oz). Advance diet Up with therapy D/C IV fluids when tolerating POs well Plan for discharge tomorrow  DVT Prophylaxis - Xarelto Weight-Bearing as tolerated  D/C O2 and Pulse OX and try on Room Air   Will get her up with therapy today. Plan for DC home tomorrow with HHPT. Will continue to monitor labs.   Ardeen Jourdain, PA-C Orthopaedic Surgery 12/23/2016, 7:17 AM

## 2016-12-23 NOTE — Progress Notes (Signed)
Discharge planning, spoke with patient at bedside. Have chosen [facility] for [facility] PT. Contacted [facility] for referral. Has RW, does not think she will need 3n1, will f/u with patient after therapy. [phone]

## 2016-12-24 LAB — CBC
HEMATOCRIT: 33.8 % — AB (ref 36.0–46.0)
HEMOGLOBIN: 11 g/dL — AB (ref 12.0–15.0)
MCH: 31.3 pg (ref 26.0–34.0)
MCHC: 32.5 g/dL (ref 30.0–36.0)
MCV: 96.3 fL (ref 78.0–100.0)
Platelets: 252 10*3/uL (ref 150–400)
RBC: 3.51 MIL/uL — ABNORMAL LOW (ref 3.87–5.11)
RDW: 13.4 % (ref 11.5–15.5)
WBC: 9.8 10*3/uL (ref 4.0–10.5)

## 2016-12-24 LAB — BASIC METABOLIC PANEL
ANION GAP: 7 (ref 5–15)
BUN: 13 mg/dL (ref 6–20)
CHLORIDE: 106 mmol/L (ref 101–111)
CO2: 28 mmol/L (ref 22–32)
CREATININE: 0.71 mg/dL (ref 0.44–1.00)
Calcium: 8.7 mg/dL — ABNORMAL LOW (ref 8.9–10.3)
GFR calc non Af Amer: >60 mL/min (ref >60)
GLUCOSE: 135 mg/dL — AB (ref 65–99)
Potassium: 3.7 mmol/L (ref 3.5–5.1)
Sodium: 141 mmol/L (ref 135–145)

## 2016-12-24 MED ORDER — CYCLOBENZAPRINE HCL 10 MG PO TABS: 10.0000 mg | ORAL_TABLET | Freq: Three times a day (TID) | ORAL | 0 refills | Status: DC | PRN

## 2016-12-24 MED ORDER — OXYCODONE HCL 5 MG PO TABS
5.0000 mg | ORAL_TABLET | ORAL | Status: DC | PRN
  Administered 2016-12-24: 10 mg via ORAL
  Filled 2016-12-24: qty 2

## 2016-12-24 MED ORDER — OXYCODONE-ACETAMINOPHEN 5-325 MG PO TABS: 1.0000 | ORAL_TABLET | Freq: Every day | ORAL | Status: DC

## 2016-12-24 MED ORDER — HYDROCODONE-ACETAMINOPHEN 7.5-325 MG PO TABS: 1.0000 | ORAL_TABLET | ORAL | 0 refills | Status: DC | PRN

## 2016-12-24 NOTE — Progress Notes (Signed)
Physical Therapy Treatment Patient Details Name: [name] MRN: [id number] DOB: 1956-07-19 Today's Date: 12/24/2016    History of Present Illness s/p R TKA    PT Comments    PATIENT REPORTS HAVING VERY  DIFFICULT TIME OVERNIGHT WITH PAIN CONTROL. CURRENTLY 5/10. WILL RETURN TO AMBULATE. EXTRA TIME FOR THERE EX, UNABLE TO TOLERATE MUCH KNEE FLEXION DUE TO  C/o PAIN. THE RIGHT LEG IS NOTED TO BE EDEMATOUS, ESPECIALLY THIGH AND KNEE.   Follow Up Recommendations  Home health PT;Supervision/Assistance - 24 hour     Equipment Recommendations  None recommended by PT    Recommendations for Other Services       Precautions / Restrictions Precautions Precautions: Knee;Fall Required Braces or Orthoses: Knee Immobilizer - Right Knee Immobilizer - Right: Discontinue once straight leg raise with < 10 degree lag    Mobility  Bed Mobility                  Transfers                    Ambulation/Gait                 Stairs            Wheelchair Mobility    Modified Rankin (Stroke Patients Only)       Balance                                            Cognition Arousal/Alertness: Awake/alert Behavior During Therapy: WFL for tasks assessed/performed                                          Exercises Total Joint Exercises Ankle Circles/Pumps: AROM;Both;10 reps Quad Sets: AROM;Both;10 reps Hip ABduction/ADduction: AAROM;Right;10 reps Straight Leg Raises: AAROM;Right;10 reps    General Comments        Pertinent Vitals/Pain Pain Assessment: 0-10 Pain Score: 5  Pain Location: R knee Pain Descriptors / Indicators: Aching;Squeezing;Throbbing;Discomfort Pain Intervention(s): Premedicated before session    Home Living                      Prior Function            PT Goals (current goals can now be found in the care plan section) Progress towards PT goals: Progressing toward goals     Frequency    7X/week      PT Plan Current plan remains appropriate    Co-evaluation              AM-PAC PT "6 Clicks" Daily Activity  Outcome Measure  Difficulty turning over in bed (including adjusting bedclothes, sheets and blankets)?: A Little Difficulty moving from lying on back to sitting on the side of the bed? : A Little Difficulty sitting down on and standing up from a chair with arms (e.g., wheelchair, bedside commode, etc,.)?: A Little Help needed moving to and from a bed to chair (including a wheelchair)?: A Little Help needed walking in hospital room?: A Little Help needed climbing 3-5 steps with a railing? : A Little 6 Click Score: 18    End of Session   Activity Tolerance: Patient tolerated treatment well Patient left: in bed;with call bell/phone within reach Nurse Communication:  Mobility status PT Visit Diagnosis: Pain;Difficulty in walking, not elsewhere classified (R26.2) Pain - Right/Left: Right Pain - part of body: Knee     Time: 2297-9892 PT Time Calculation (min) (ACUTE ONLY): 28 min  Charges:  $Therapeutic Exercise: 23-37 mins                    G CodesTresa Keller PT [phone]    [name] 12/24/2016, 11:56 AM

## 2016-12-24 NOTE — Discharge Summary (Signed)
Physician Discharge Summary   Patient ID: LYFE REIHL MRN: [id number] DOB/AGE: 1955-11-26 61 y.o.  Admit date: 12/22/2016 Discharge date: 12/24/2016  Primary Diagnosis: Primary osteoarthritis right knee   Admission Diagnoses:  Past Medical History:  Diagnosis Date  . Abdominal distension   . Abdominal pain   . Arthritis   . Arthritis pain   . Asthma   . Biliary dyskinesia   . Chronic kidney disease    kidney stones  . Complication of anesthesia    Difficulty arousing  . Constipation   . Diarrhea   . GERD (gastroesophageal reflux disease)   . H/O hiatal hernia   . Liver tumor (benign) 05/2016  . PONV (postoperative nausea and vomiting)   . Spondylolisthesis of lumbosacral region   . Trouble swallowing    Discharge Diagnoses:   Active Problems:   Primary osteoarthritis of right knee   S/P total knee arthroplasty, right  Estimated body mass index is 29.87 kg/m as calculated from the following:   Height as of this encounter: 5' 5"  (1.651 m).   Weight as of this encounter: 81.4 kg (179 lb 8 oz).  Procedure:  Procedure(s) (LRB): RIGHT TOTAL KNEE ARTHROPLASTY (Right)   Consults: None  HPI: [name], 61 y.o. female, has a history of pain and functional disability in the right knee due to trauma and arthritis and has failed non-surgical conservative treatments for greater than 12 weeks to includeNSAID's and/or analgesics, corticosteriod injections, viscosupplementation injections, flexibility and strengthening excercises and activity modification.  Onset of symptoms was gradual, starting 3 years ago with gradually worsening course since that time. The patient noted prior procedures on the knee to include  arthroscopy and menisectomy on the right knee(s).  Patient currently rates pain in the right knee(s) at 9 out of 10 with activity. Patient has night pain, worsening of pain with activity and weight bearing, pain that interferes with activities of daily living, pain with  passive range of motion, crepitus and joint swelling.  Patient has evidence of periarticular osteophytes and joint space narrowing by imaging studies. . There is no active infection.  Laboratory Data: Admission on 12/22/2016, Discharged on 12/24/2016  Component Date Value Ref Range Status  . WBC 12/23/2016 10.8* 4.0 - 10.5 K/uL Final  . RBC 12/23/2016 3.64* 3.87 - 5.11 MIL/uL Final  . Hemoglobin 12/23/2016 11.5* 12.0 - 15.0 g/dL Final  . HCT 12/23/2016 34.1* 36.0 - 46.0 % Final  . MCV 12/23/2016 93.7  78.0 - 100.0 fL Final  . MCH 12/23/2016 31.6  26.0 - 34.0 pg Final  . MCHC 12/23/2016 33.7  30.0 - 36.0 g/dL Final  . RDW 12/23/2016 12.8  11.5 - 15.5 % Final  . Platelets 12/23/2016 255  150 - 400 K/uL Final  . Sodium 12/23/2016 140  135 - 145 mmol/L Final  . Potassium 12/23/2016 4.2  3.5 - 5.1 mmol/L Final  . Chloride 12/23/2016 107  101 - 111 mmol/L Final  . CO2 12/23/2016 28  22 - 32 mmol/L Final  . Glucose, Bld 12/23/2016 126* 65 - 99 mg/dL Final  . BUN 12/23/2016 9  6 - 20 mg/dL Final  . Creatinine, Ser 12/23/2016 0.67  0.44 - 1.00 mg/dL Final  . Calcium 12/23/2016 8.7* 8.9 - 10.3 mg/dL Final  . GFR calc non Af Amer 12/23/2016 >60  >60 mL/min Final  . GFR calc Af Amer 12/23/2016 >60  >60 mL/min Final   Comment: (NOTE) The eGFR has been calculated using the CKD EPI equation.  This calculation has not been validated in all clinical situations. eGFR's persistently <60 mL/min signify possible Chronic Kidney Disease.   . Anion gap 12/23/2016 5  5 - 15 Final  . WBC 12/24/2016 9.8  4.0 - 10.5 K/uL Final  . RBC 12/24/2016 3.51* 3.87 - 5.11 MIL/uL Final  . Hemoglobin 12/24/2016 11.0* 12.0 - 15.0 g/dL Final  . HCT 12/24/2016 33.8* 36.0 - 46.0 % Final  . MCV 12/24/2016 96.3  78.0 - 100.0 fL Final  . MCH 12/24/2016 31.3  26.0 - 34.0 pg Final  . MCHC 12/24/2016 32.5  30.0 - 36.0 g/dL Final  . RDW 12/24/2016 13.4  11.5 - 15.5 % Final  . Platelets 12/24/2016 252  150 - 400 K/uL Final  .  Sodium 12/24/2016 141  135 - 145 mmol/L Final  . Potassium 12/24/2016 3.7  3.5 - 5.1 mmol/L Final  . Chloride 12/24/2016 106  101 - 111 mmol/L Final  . CO2 12/24/2016 28  22 - 32 mmol/L Final  . Glucose, Bld 12/24/2016 135* 65 - 99 mg/dL Final  . BUN 12/24/2016 13  6 - 20 mg/dL Final  . Creatinine, Ser 12/24/2016 0.71  0.44 - 1.00 mg/dL Final  . Calcium 12/24/2016 8.7* 8.9 - 10.3 mg/dL Final  . GFR calc non Af Amer 12/24/2016 >60  >60 mL/min Final  . GFR calc Af Amer 12/24/2016 >60  >60 mL/min Final   Comment: (NOTE) The eGFR has been calculated using the CKD EPI equation. This calculation has not been validated in all clinical situations. eGFR's persistently <60 mL/min signify possible Chronic Kidney Disease.   Georgiann Hahn gap 12/24/2016 7  5 - 15 Final  Hospital Outpatient Visit on 12/16/2016  Component Date Value Ref Range Status  . aPTT 12/16/2016 23* 24 - 36 seconds Final  . WBC 12/16/2016 5.2  4.0 - 10.5 K/uL Final  . RBC 12/16/2016 4.23  3.87 - 5.11 MIL/uL Final  . Hemoglobin 12/16/2016 13.4  12.0 - 15.0 g/dL Final  . HCT 12/16/2016 40.1  36.0 - 46.0 % Final  . MCV 12/16/2016 94.8  78.0 - 100.0 fL Final  . MCH 12/16/2016 31.7  26.0 - 34.0 pg Final  . MCHC 12/16/2016 33.4  30.0 - 36.0 g/dL Final  . RDW 12/16/2016 12.8  11.5 - 15.5 % Final  . Platelets 12/16/2016 260  150 - 400 K/uL Final  . Neutrophils Relative % 12/16/2016 50  % Final  . Neutro Abs 12/16/2016 2.6  1.7 - 7.7 K/uL Final  . Lymphocytes Relative 12/16/2016 39  % Final  . Lymphs Abs 12/16/2016 2.0  0.7 - 4.0 K/uL Final  . Monocytes Relative 12/16/2016 9  % Final  . Monocytes Absolute 12/16/2016 0.4  0.1 - 1.0 K/uL Final  . Eosinophils Relative 12/16/2016 2  % Final  . Eosinophils Absolute 12/16/2016 0.1  0.0 - 0.7 K/uL Final  . Basophils Relative 12/16/2016 0  % Final  . Basophils Absolute 12/16/2016 0.0  0.0 - 0.1 K/uL Final  . Sodium 12/16/2016 144  135 - 145 mmol/L Final  . Potassium 12/16/2016 4.9  3.5 - 5.1  mmol/L Final  . Chloride 12/16/2016 109  101 - 111 mmol/L Final  . CO2 12/16/2016 29  22 - 32 mmol/L Final  . Glucose, Bld 12/16/2016 111* 65 - 99 mg/dL Final  . BUN 12/16/2016 14  6 - 20 mg/dL Final  . Creatinine, Ser 12/16/2016 0.77  0.44 - 1.00 mg/dL Final  . Calcium 12/16/2016 9.5  8.9 -  10.3 mg/dL Final  . Total Protein 12/16/2016 7.4  6.5 - 8.1 g/dL Final  . Albumin 12/16/2016 4.4  3.5 - 5.0 g/dL Final  . AST 12/16/2016 21  15 - 41 U/L Final  . ALT 12/16/2016 18  14 - 54 U/L Final  . Alkaline Phosphatase 12/16/2016 71  38 - 126 U/L Final  . Total Bilirubin 12/16/2016 0.7  0.3 - 1.2 mg/dL Final  . GFR calc non Af Amer 12/16/2016 >60  >60 mL/min Final  . GFR calc Af Amer 12/16/2016 >60  >60 mL/min Final   Comment: (NOTE) The eGFR has been calculated using the CKD EPI equation. This calculation has not been validated in all clinical situations. eGFR's persistently <60 mL/min signify possible Chronic Kidney Disease.   . Anion gap 12/16/2016 6  5 - 15 Final  . Prothrombin Time 12/16/2016 12.6  11.4 - 15.2 seconds Final  . INR 12/16/2016 0.94   Final  . ABO/RH(D) 12/16/2016 A POS   Final  . Antibody Screen 12/16/2016 NEG   Final  . Sample Expiration 12/16/2016 12/25/2016   Final  . Extend sample reason 12/16/2016 NO TRANSFUSIONS OR PREGNANCY IN THE PAST 3 MONTHS   Final  . Color, Urine 12/16/2016 YELLOW  YELLOW Final  . APPearance 12/16/2016 CLEAR  CLEAR Final  . Specific Gravity, Urine 12/16/2016 1.018  1.005 - 1.030 Final  . pH 12/16/2016 5.0  5.0 - 8.0 Final  . Glucose, UA 12/16/2016 NEGATIVE  NEGATIVE mg/dL Final  . Hgb urine dipstick 12/16/2016 NEGATIVE  NEGATIVE Final  . Bilirubin Urine 12/16/2016 NEGATIVE  NEGATIVE Final  . Ketones, ur 12/16/2016 NEGATIVE  NEGATIVE mg/dL Final  . Protein, ur 12/16/2016 NEGATIVE  NEGATIVE mg/dL Final  . Nitrite 12/16/2016 NEGATIVE  NEGATIVE Final  . Leukocytes, UA 12/16/2016 NEGATIVE  NEGATIVE Final  . MRSA, PCR 12/16/2016 NEGATIVE   NEGATIVE Final  . Staphylococcus aureus 12/16/2016 NEGATIVE  NEGATIVE Final   Comment:        The Xpert SA Assay (FDA approved for NASAL specimens in patients over 69 years of age), is one component of a comprehensive surveillance program.  Test performance has been validated by [facility] for patients greater than or equal to 82 year old. It is not intended to diagnose infection nor to guide or monitor treatment.   . ABO/RH(D) 12/16/2016 A POS   Final     X-Rays:Dg Chest 2 View  Result Date: 12/16/2016 CLINICAL DATA:  Knee replacement. EXAM: CHEST  2 VIEW COMPARISON:  No recent. FINDINGS: Mediastinum and hilar structures are normal. Left base subsegmental atelectasis. No pleural effusion or pneumothorax. Heart size normal. No acute bony abnormality . IMPRESSION: Mild left base subsegmental atelectasis. Exam otherwise unremarkable. Electronically Signed   By: Marcello Moores  Register   On: 12/16/2016 10:21    EKG: Orders placed or performed during the hospital encounter of 12/16/16  . EKG  . EKG     Hospital Course: [name] is a 61 y.o. who was admitted to [facility]. They were brought to the operating room on 12/22/2016 and underwent Procedure(s): RIGHT TOTAL KNEE ARTHROPLASTY.  Patient tolerated the procedure well and was later transferred to the recovery room and then to the orthopaedic floor for postoperative care.  They were given PO and IV analgesics for pain control following their surgery.  They were given 24 hours of postoperative antibiotics of  Anti-infectives    Start     Dose/Rate Route Frequency Ordered Stop   12/22/16  2200  vancomycin (VANCOCIN) IVPB 1000 mg/200 mL premix     1,000 mg 200 mL/hr over 60 Minutes Intravenous Every 12 hours 12/22/16 1451 12/22/16 2246   12/22/16 1211  polymyxin B 500,000 Units, bacitracin 50,000 Units in sodium chloride irrigation 0.9 % 500 mL irrigation  Status:  Discontinued       As needed 12/22/16 1211 12/22/16 1316    12/22/16 0934  vancomycin (VANCOCIN) IVPB 1000 mg/200 mL premix     1,000 mg 200 mL/hr over 60 Minutes Intravenous On call to O.R. 12/22/16 0814 12/22/16 1214     and started on DVT prophylaxis in the form of Xarelto.   PT and OT were ordered for total joint protocol.  Discharge planning consulted to help with postop disposition and equipment needs.  Patient had a fair night on the evening of surgery.  They started to get up OOB with therapy on day one. Hemovac drain was pulled without difficulty.  She had some increased discomfort with therapy and pain management was adjusted. Continued to work with therapy into day two.  Dressing was changed on day two and the incision was clean and dry.  The patient had progressed with therapy and meeting their goals.  Incision was healing well.  Patient was seen in rounds and was ready to go home.   Diet: Cardiac diet Activity:WBAT Follow-up:in 2 weeks Disposition - Home Discharged Condition: stable   Discharge Instructions    Call MD / Call 911    Complete by:  As directed    If you experience chest pain or shortness of breath, CALL 911 and be transported to the hospital emergency room.  If you develope a fever above 101 F, pus (white drainage) or increased drainage or redness at the wound, or calf pain, call your surgeon's office.   Constipation Prevention    Complete by:  As directed    Drink plenty of fluids.  Prune juice may be helpful.  You may use a stool softener, such as Colace (over the counter) 100 mg twice a day.  Use MiraLax (over the counter) for constipation as needed.   Diet - low sodium heart healthy    Complete by:  As directed    Discharge instructions    Complete by:  As directed    INSTRUCTIONS AFTER JOINT REPLACEMENT   Remove items at home which could result in a fall. This includes throw rugs or furniture in walking pathways ICE to the affected joint every three hours while awake for 30 minutes at a time, for at least the first  3-5 days, and then as needed for pain and swelling.  Continue to use ice for pain and swelling. You may notice swelling that will progress down to the foot and ankle.  This is normal after surgery.  Elevate your leg when you are not up walking on it.   Continue to use the breathing machine you got in the hospital (incentive spirometer) which will help keep your temperature down.  It is common for your temperature to cycle up and down following surgery, especially at night when you are not up moving around and exerting yourself.  The breathing machine keeps your lungs expanded and your temperature down.   DIET:  As you were doing prior to hospitalization, we recommend a well-balanced diet.  DRESSING / WOUND CARE / SHOWERING  You may change your dressing every day with sterile gauze.  Please use good hand washing techniques before changing the dressing.  Do not use any lotions or creams on the incision until instructed by your surgeon.  ACTIVITY  Increase activity slowly as tolerated, but follow the weight bearing instructions below.   No driving for 6 weeks or until further direction given by your physician.  You cannot drive while taking narcotics.  No lifting or carrying greater than 10 lbs. until further directed by your surgeon. Avoid periods of inactivity such as sitting longer than an hour when not asleep. This helps prevent blood clots.  You may return to work once you are authorized by your doctor.     WEIGHT BEARING   Weight bearing as tolerated with assist device (walker, cane, etc) as directed, use it as long as suggested by your surgeon or therapist, typically at least 4-6 weeks.   EXERCISES  Results after joint replacement surgery are often greatly improved when you follow the exercise, range of motion and muscle strengthening exercises prescribed by your doctor. Safety measures are also important to protect the joint from further injury. Any time any of these exercises cause  you to have increased pain or swelling, decrease what you are doing until you are comfortable again and then slowly increase them. If you have problems or questions, call your caregiver or physical therapist for advice.   Rehabilitation is important following a joint replacement. After just a few days of immobilization, the muscles of the leg can become weakened and shrink (atrophy).  These exercises are designed to build up the tone and strength of the thigh and leg muscles and to improve motion. Often times heat used for twenty to thirty minutes before working out will loosen up your tissues and help with improving the range of motion but do not use heat for the first two weeks following surgery (sometimes heat can increase post-operative swelling).   These exercises can be done on a training (exercise) mat, on the floor, on a table or on a bed. Use whatever works the best and is most comfortable for you.    Use music or television while you are exercising so that the exercises are a pleasant break in your day. This will make your life better with the exercises acting as a break in your routine that you can look forward to.   Perform all exercises about fifteen times, three times per day or as directed.  You should exercise both the operative leg and the other leg as well.  Exercises include:   Quad Sets - Tighten up the muscle on the front of the thigh (Quad) and hold for 5-10 seconds.   Straight Leg Raises - With your knee straight (if you were given a brace, keep it on), lift the leg to 60 degrees, hold for 3 seconds, and slowly lower the leg.  Perform this exercise against resistance later as your leg gets stronger.  Leg Slides: Lying on your back, slowly slide your foot toward your buttocks, bending your knee up off the floor (only go as far as is comfortable). Then slowly slide your foot back down until your leg is flat on the floor again.  Angel Wings: Lying on your back spread your legs to the  side as far apart as you can without causing discomfort.  Hamstring Strength:  Lying on your back, push your heel against the floor with your leg straight by tightening up the muscles of your buttocks.  Repeat, but this time bend your knee to a comfortable angle, and push your heel against the floor.  You may put a pillow under the heel to make it more comfortable if necessary.   A rehabilitation program following joint replacement surgery can speed recovery and prevent re-injury in the future due to weakened muscles. Contact your doctor or a physical therapist for more information on knee rehabilitation.    CONSTIPATION  Constipation is defined medically as fewer than three stools per week and severe constipation as less than one stool per week.  Even if you have a regular bowel pattern at home, your normal regimen is likely to be disrupted due to multiple reasons following surgery.  Combination of anesthesia, postoperative narcotics, change in appetite and fluid intake all can affect your bowels.   YOU MUST use at least one of the following options; they are listed in order of increasing strength to get the job done.  They are all available over the counter, and you may need to use some, POSSIBLY even all of these options:    Drink plenty of fluids (prune juice may be helpful) and high fiber foods Colace 100 mg by mouth twice a day  Senokot for constipation as directed and as needed Dulcolax (bisacodyl), take with full glass of water  Miralax (polyethylene glycol) once or twice a day as needed.  If you have tried all these things and are unable to have a bowel movement in the first 3-4 days after surgery call either your surgeon or your primary doctor.    If you experience loose stools or diarrhea, hold the medications until you stool forms back up.  If your symptoms do not get better within 1 week or if they get worse, check with your doctor.  If you experience "the worst abdominal pain ever"  or develop nausea or vomiting, please contact the office immediately for further recommendations for treatment.   ITCHING:  If you experience itching with your medications, try taking only a single pain pill, or even half a pain pill at a time.  You can also use Benadryl over the counter for itching or also to help with sleep.   TED HOSE STOCKINGS:  Use stockings on both legs until for at least 2 weeks or as directed by physician office. They may be removed at night for sleeping.  MEDICATIONS:  See your medication summary on the "After Visit Summary" that nursing will review with you.  You may have some home medications which will be placed on hold until you complete the course of blood thinner medication.  It is important for you to complete the blood thinner medication as prescribed.  PRECAUTIONS:  If you experience chest pain or shortness of breath - call 911 immediately for transfer to the hospital emergency department.   If you develop a fever greater that 101 F, purulent drainage from wound, increased redness or drainage from wound, foul odor from the wound/dressing, or calf pain - CONTACT YOUR SURGEON.                                                   FOLLOW-UP APPOINTMENTS:  If you do not already have a post-op appointment, please call the office for an appointment to be seen by your surgeon.  Guidelines for how soon to be seen are listed in your "After Visit Summary", but are typically between 1-4 weeks after surgery.   MAKE SURE YOU:  Understand these instructions.  Get help right away if you are not doing well or get worse.    Thank you for letting us be a part of your medical care team.  It is a privilege we respect greatly.  We hope these instructions will help you stay on track for a fast and full recovery!   Increase activity slowly as tolerated    Complete by:  As directed      Allergies as of 12/24/2016      Reactions   Morphine And Related Other (See Comments)   Low  b/p.Karma Greaser...hallucinations Pt was hospitalized due to reaction.   Penicillins Anaphylaxis   Has patient had a PCN reaction causing immediate rash, facial/tongue/throat swelling, SOB or lightheadedness with hypotension: Yes Has patient had a PCN reaction causing severe rash involving mucus membranes or skin necrosis: No Has patient had a PCN reaction that required hospitalization: No Has patient had a PCN reaction occurring within the last 10 years: No If all of the above answers are "NO", then may proceed with Cephalosporin use.   Chlorhexidine Hives, Itching   Latex Itching, Other (See Comments)   Blistering   Erythromycin Hives, Rash      Medication List    STOP taking these medications   oxyCODONE-acetaminophen 10-325 MG tablet Commonly known as:  PERCOCET   traMADol 50 MG tablet Commonly known as:  ULTRAM     TAKE these medications   aspirin EC 325 MG tablet Take 1 tablet (325 mg total) by mouth 2 (two) times daily.   cyclobenzaprine 10 MG tablet Commonly known as:  FLEXERIL Take 1 tablet (10 mg total) by mouth 3 (three) times daily as needed for muscle spasms.   gabapentin 300 MG capsule Commonly known as:  NEURONTIN Take 300 mg by mouth daily.   HYDROcodone-acetaminophen 7.5-325 MG tablet Commonly known as:  NORCO Take 1-2 tablets by mouth every 4 (four) hours as needed for severe pain.   metoCLOPramide 10 MG tablet Commonly known as:  REGLAN Take 1 tablet (10 mg total) by mouth every 8 (eight) hours as needed for nausea.   oxyCODONE 5 MG immediate release tablet Commonly known as:  Oxy IR/ROXICODONE Take 1-2 tablets (5-10 mg total) by mouth every 4 (four) hours as needed for severe pain.   pantoprazole 40 MG tablet Commonly known as:  PROTONIX Take 40 mg by mouth daily.   VENTOLIN HFA 108 (90 Base) MCG/ACT inhaler Generic drug:  albuterol Inhale 1-2 puffs into the lungs every 6 (six) hours as needed for wheezing or shortness of breath.      Follow-up  Information    Latanya Maudlin, MD. Schedule an appointment as soon as possible for a visit in 2 week(s).   Specialty:  Orthopedic Surgery Contact information: [address] [place] [address] [phone]        Home, [facility] Follow up.   Specialty:  [place] Why:  physical therapy Contact information: [address] [place] [address] [phone]           Signed: Ardeen Jourdain, PA-C Orthopaedic Surgery 12/24/2016, 4:37 PM

## 2016-12-24 NOTE — Progress Notes (Signed)
OT Cancellation Note  Patient Details Name: [name] MRN: [id number] DOB: 07-16-1956   Cancelled Treatment:    Reason Eval/Treat Not Completed: Other (comment).  Followed up with pt for needs:  None at this time. She will have someone staying with her and assisting as needed.  Familiar with reacher for LB dressing  Kimyata Milich 12/24/2016, 9:32 AM  Lesle Chris, OTR/L [phone] 12/24/2016

## 2016-12-24 NOTE — Progress Notes (Signed)
   Subjective: 2 Days Post-Op Procedure(s) (LRB): RIGHT TOTAL KNEE ARTHROPLASTY (Right) Patient reports pain as severe.   Patient seen in rounds with Dr. Gladstone Lighter. Patient is having problems with pain in the right knee, requiring pain medications. Reports inability to get in front of the pain. Had some issues with hypotension due to pain medications. No SOB or chest pain. Voiding well. Positive flatus.    Objective: Vital signs in last 24 hours: Temp:  [98.8 F (37.1 C)-99.3 F (37.4 C)] 99.2 F (37.3 C) (06/08 0522) Pulse Rate:  [70-81] 70 (06/08 0544) Resp:  [12-16] 12 (06/08 0522) BP: (73-136)/(44-74) 126/68 (06/08 0544) SpO2:  [85 %-99 %] 95 % (06/08 0544)  Intake/Output from previous day:  Intake/Output Summary (Last 24 hours) at 12/24/16 0739 Last data filed at 12/24/16 0339  Gross per 24 hour  Intake             1505 ml  Output             1650 ml  Net             -145 ml     Labs:  Recent Labs  12/23/16 0430 12/24/16 0505  HGB 11.5* 11.0*    Recent Labs  12/23/16 0430 12/24/16 0505  WBC 10.8* 9.8  RBC 3.64* 3.51*  HCT 34.1* 33.8*  PLT 255 252    Recent Labs  12/23/16 0430 12/24/16 0505  NA 140 141  K 4.2 3.7  CL 107 106  CO2 28 28  BUN 9 13  CREATININE 0.67 0.71  GLUCOSE 126* 135*  CALCIUM 8.7* 8.7*    EXAM General - Patient is Alert and Oriented Extremity - Neurologically intact Intact pulses distally Dorsiflexion/Plantar flexion intact No cellulitis present Compartment soft Dressing/Incision - clean, dry, no drainage Motor Function - intact, moving foot and toes well on exam.   Past Medical History:  Diagnosis Date  . Abdominal distension   . Abdominal pain   . Arthritis   . Arthritis pain   . Asthma   . Biliary dyskinesia   . Chronic kidney disease    kidney stones  . Complication of anesthesia    Difficulty arousing  . Constipation   . Diarrhea   . GERD (gastroesophageal reflux disease)   . H/O hiatal hernia   . Liver  tumor (benign) 05/2016  . PONV (postoperative nausea and vomiting)   . Spondylolisthesis of lumbosacral region   . Trouble swallowing     Assessment/Plan: 2 Days Post-Op Procedure(s) (LRB): RIGHT TOTAL KNEE ARTHROPLASTY (Right) Active Problems:   Primary osteoarthritis of right knee   S/P total knee arthroplasty, right  Estimated body mass index is 29.87 kg/m as calculated from the following:   Height as of this encounter: 5\' 5"  (1.651 m).   Weight as of this encounter: 81.4 kg (179 lb 8 oz). Advance diet Up with therapy Discharge home with home health  DVT Prophylaxis - Xarelto Weight-Bearing as tolerated   Will continue with therapy today. Hopefully we will see improvement in her pain control today with change in medications. DC home this afternoon after two sessions of therapy. DC instructions given. Discussed with her that our goal at this time is not complete pain relief but adequate pain relief to do therapy and that we have to make sure her respiratory patterns or blood pressure are not compromised.   Ardeen Jourdain, PA-C Orthopaedic Surgery 12/24/2016, 7:39 AM

## 2016-12-24 NOTE — Progress Notes (Signed)
Physical Therapy Treatment Patient Details Name: [name] MRN: [id number] DOB: 15-Jun-1956 Today's Date: 12/24/2016    History of Present Illness s/p R TKA    PT Comments    The patient continues to complain if pain and tightness of the right knee  Which limited ambulation Will see in PM for ambulation.   Follow Up Recommendations  Home health PT;Supervision/Assistance - 24 hour     Equipment Recommendations  None recommended by PT    Recommendations for Other Services       Precautions / Restrictions Precautions Precautions: Knee;Fall Required Braces or Orthoses: Knee Immobilizer - Right Knee Immobilizer - Right: Discontinue once straight leg raise with < 10 degree lag    Mobility  Bed Mobility   Bed Mobility: Supine to Sit;Sit to Supine     Supine to sit: Modified independent (Device/Increase time) Sit to supine: Min guard   General bed mobility comments: used leg lifter to  self assist right leg.  Transfers Overall transfer level: Needs assistance Equipment used: Rolling walker (2 wheeled) Transfers: Sit to/from Stand Sit to Stand: Min guard         General transfer comment: for safety  Ambulation/Gait Ambulation/Gait assistance: Min guard Ambulation Distance (Feet): 60 Feet Assistive device: Rolling walker (2 wheeled) Gait Pattern/deviations: Step-to pattern;Step-through pattern;Antalgic     General Gait Details: less tolerated ambulation due to c/o pain.   Stairs            Wheelchair Mobility    Modified Rankin (Stroke Patients Only)       Balance                                            Cognition Arousal/Alertness: Awake/alert Behavior During Therapy: WFL for tasks assessed/performed                                          Exercises    General Comments        Pertinent Vitals/Pain Pain Assessment: 0-10 Pain Score: 7  Pain Location: R knee Pain Descriptors / Indicators:  Aching;Squeezing;Throbbing;Discomfort Pain Intervention(s): Premedicated before session;Repositioned;Ice applied    Home Living                      Prior Function            PT Goals (current goals can now be found in the care plan section) Progress towards PT goals: Progressing toward goals    Frequency    7X/week      PT Plan Current plan remains appropriate    Co-evaluation              AM-PAC PT "6 Clicks" Daily Activity  Outcome Measure  Difficulty turning over in bed (including adjusting bedclothes, sheets and blankets)?: None Difficulty moving from lying on back to sitting on the side of the bed? : A Little Difficulty sitting down on and standing up from a chair with arms (e.g., wheelchair, bedside commode, etc,.)?: A Little Help needed moving to and from a bed to chair (including a wheelchair)?: A Little Help needed walking in hospital room?: A Little Help needed climbing 3-5 steps with a railing? : A Little 6 Click Score: 19    End of Session Equipment  Utilized During Treatment: Right knee immobilizer Activity Tolerance: Patient limited by pain Patient left: with call bell/phone within reach Nurse Communication: Mobility status PT Visit Diagnosis: Pain;Difficulty in walking, not elsewhere classified (R26.2) Pain - Right/Left: Right Pain - part of body: Knee     Time: 1020-1040 PT Time Calculation (min) (ACUTE ONLY): 20 min  Charges:  $Gait Training: 8-22 mins $Therapeutic Exercise: 23-37 mins                    G Codes:          [name] 12/24/2016, 1:24 PM

## 2016-12-24 NOTE — Progress Notes (Signed)
Physical Therapy Treatment Patient Details Name: [name] MRN: [id number] DOB: 02/05/1956 Today's Date: 12/24/2016    History of Present Illness s/p R TKA    PT Comments    Ready to go home   Follow Up Recommendations  Home health PT;Supervision/Assistance - 24 hour     Equipment Recommendations  None recommended by PT    Recommendations for Other Services       Precautions / Restrictions Precautions Precautions: Knee;Fall Required Braces or Orthoses: Knee Immobilizer - Right Knee Immobilizer - Right: Discontinue once straight leg raise with < 10 degree lag    Mobility  Bed Mobility   Bed Mobility: Supine to Sit     Supine to sit: Min assist Sit to supine: Min guard   General bed mobility comments: support right leg  Transfers Overall transfer level: Needs assistance Equipment used: Rolling walker (2 wheeled) Transfers: Sit to/from Stand Sit to Stand: Min guard         General transfer comment: for safety  Ambulation/Gait Ambulation/Gait assistance: Min guard Ambulation Distance (Feet): 10 Feet Assistive device: Rolling walker (2 wheeled) Gait Pattern/deviations: Step-to pattern;Antalgic     General Gait Details: less tolerated ambulation due to c/o pain.   Stairs            Wheelchair Mobility    Modified Rankin (Stroke Patients Only)       Balance                                            Cognition Arousal/Alertness: Awake/alert Behavior During Therapy: Anxious                                          Exercises      General Comments        Pertinent Vitals/Pain Pain Score: 5  Faces Pain Scale: Hurts even more Pain Location: R knee Pain Descriptors / Indicators: Aching;Squeezing;Throbbing;Discomfort Pain Intervention(s): Monitored during session;Premedicated before session    Home Living                      Prior Function            PT Goals (current goals can now  be found in the care plan section) Progress towards PT goals: Progressing toward goals    Frequency    7X/week      PT Plan Current plan remains appropriate    Co-evaluation              AM-PAC PT "6 Clicks" Daily Activity  Outcome Measure  Difficulty turning over in bed (including adjusting bedclothes, sheets and blankets)?: A Little Difficulty moving from lying on back to sitting on the side of the bed? : A Little Difficulty sitting down on and standing up from a chair with arms (e.g., wheelchair, bedside commode, etc,.)?: A Little Help needed moving to and from a bed to chair (including a wheelchair)?: A Little Help needed walking in hospital room?: A Little Help needed climbing 3-5 steps with a railing? : A Little 6 Click Score: 18    End of Session Equipment Utilized During Treatment: Right knee immobilizer Activity Tolerance: Patient limited by pain Patient left: in chair;with family/visitor present;with nursing/sitter in room Nurse Communication: Mobility status PT  Visit Diagnosis: Pain;Difficulty in walking, not elsewhere classified (R26.2) Pain - Right/Left: Right Pain - part of body: Knee     Time: 1350-1403 PT Time Calculation (min) (ACUTE ONLY): 13 min  Charges:  $Gait Training: 8-22 mins                    G Codes:          [name] 12/24/2016, 5:00 PM

## 2017-01-26 DIAGNOSIS — S76111A Strain of right quadriceps muscle, fascia and tendon, initial encounter: Secondary | ICD-10-CM

## 2017-01-26 HISTORY — DX: Strain of right quadriceps muscle, fascia and tendon, initial encounter: S76.111A

## 2017-02-01 ENCOUNTER — Encounter (HOSPITAL_COMMUNITY)
Admission: RE | Admit: 2017-02-01 | Discharge: 2017-02-01 | Disposition: A | Discharge status: Home / Self Care | Payer: 59 | Source: Ambulatory Visit | Attending: Orthopedic Surgery | Admitting: Orthopedic Surgery

## 2017-02-01 ENCOUNTER — Encounter (HOSPITAL_COMMUNITY): Payer: Self-pay

## 2017-02-01 DIAGNOSIS — J45909 Unspecified asthma, uncomplicated: Secondary | ICD-10-CM | POA: Diagnosis not present

## 2017-02-01 DIAGNOSIS — S76111A Strain of right quadriceps muscle, fascia and tendon, initial encounter: Secondary | ICD-10-CM | POA: Diagnosis not present

## 2017-02-01 DIAGNOSIS — Z7982 Long term (current) use of aspirin: Secondary | ICD-10-CM | POA: Diagnosis not present

## 2017-02-01 DIAGNOSIS — N189 Chronic kidney disease, unspecified: Secondary | ICD-10-CM | POA: Diagnosis not present

## 2017-02-01 DIAGNOSIS — K219 Gastro-esophageal reflux disease without esophagitis: Secondary | ICD-10-CM | POA: Diagnosis not present

## 2017-02-01 DIAGNOSIS — Z87891 Personal history of nicotine dependence: Secondary | ICD-10-CM | POA: Diagnosis not present

## 2017-02-01 DIAGNOSIS — Z79899 Other long term (current) drug therapy: Secondary | ICD-10-CM | POA: Diagnosis not present

## 2017-02-01 DIAGNOSIS — W109XXA Fall (on) (from) unspecified stairs and steps, initial encounter: Secondary | ICD-10-CM | POA: Diagnosis not present

## 2017-02-01 DIAGNOSIS — Z96651 Presence of right artificial knee joint: Secondary | ICD-10-CM | POA: Diagnosis not present

## 2017-02-01 HISTORY — DX: Personal history of urinary calculi: Z87.442

## 2017-02-01 HISTORY — DX: Strain of right quadriceps muscle, fascia and tendon, initial encounter: S76.111A

## 2017-02-01 LAB — COMPREHENSIVE METABOLIC PANEL
ALT: 21 U/L (ref 14–54)
AST: 25 U/L (ref 15–41)
Albumin: 4.3 g/dL (ref 3.5–5.0)
Alkaline Phosphatase: 73 U/L (ref 38–126)
Anion gap: 8 (ref 5–15)
BUN: 11 mg/dL (ref 6–20)
CO2: 26 mmol/L (ref 22–32)
Calcium: 9 mg/dL (ref 8.9–10.3)
Chloride: 106 mmol/L (ref 101–111)
Creatinine, Ser: 0.7 mg/dL (ref 0.44–1.00)
GFR calc Af Amer: >60 mL/min (ref >60)
GFR calc non Af Amer: >60 mL/min (ref >60)
Glucose, Bld: 98 mg/dL (ref 65–99)
Potassium: 3.9 mmol/L (ref 3.5–5.1)
Sodium: 140 mmol/L (ref 135–145)
Total Bilirubin: 0.4 mg/dL (ref 0.3–1.2)
Total Protein: 7.4 g/dL (ref 6.5–8.1)

## 2017-02-01 LAB — CBC WITH DIFFERENTIAL/PLATELET
Basophils Absolute: 0 10*3/uL (ref 0.0–0.1)
Basophils Relative: 0 %
Eosinophils Absolute: 0.2 10*3/uL (ref 0.0–0.7)
Eosinophils Relative: 3 %
HCT: 35.9 % — ABNORMAL LOW (ref 36.0–46.0)
Hemoglobin: 11.7 g/dL — ABNORMAL LOW (ref 12.0–15.0)
Lymphocytes Relative: 30 %
Lymphs Abs: 1.6 10*3/uL (ref 0.7–4.0)
MCH: 29.6 pg (ref 26.0–34.0)
MCHC: 32.6 g/dL (ref 30.0–36.0)
MCV: 90.9 fL (ref 78.0–100.0)
Monocytes Absolute: 0.4 10*3/uL (ref 0.1–1.0)
Monocytes Relative: 7 %
Neutro Abs: 3.3 10*3/uL (ref 1.7–7.7)
Neutrophils Relative %: 60 %
Platelets: 284 10*3/uL (ref 150–400)
RBC: 3.95 MIL/uL (ref 3.87–5.11)
RDW: 13 % (ref 11.5–15.5)
WBC: 5.5 10*3/uL (ref 4.0–10.5)

## 2017-02-01 LAB — APTT: aPTT: 25 seconds (ref 24–36)

## 2017-02-01 LAB — URINALYSIS, ROUTINE W REFLEX MICROSCOPIC
Bilirubin Urine: NEGATIVE
Glucose, UA: NEGATIVE mg/dL
Hgb urine dipstick: NEGATIVE
Ketones, ur: NEGATIVE mg/dL
Leukocytes, UA: NEGATIVE
Nitrite: NEGATIVE
Protein, ur: NEGATIVE mg/dL
Specific Gravity, Urine: 1.021 (ref 1.005–1.030)
pH: 5 (ref 5.0–8.0)

## 2017-02-01 LAB — PROTIME-INR
INR: 0.98
Prothrombin Time: 13 seconds (ref 11.4–15.2)

## 2017-02-01 NOTE — Patient Instructions (Addendum)
[name]  02/01/2017   Your procedure is scheduled on: Thursday 02/03/2017  Report to [facility] Main  Entrance Take [place]  elevators to 3rd floor to  [place] at  1130  AM.    Call this number if you have problems the morning of surgery [phone]   Remember: ONLY 1 PERSON MAY GO WITH YOU TO SHORT STAY TO GET  READY MORNING OF [place].   Do not eat food  :After Midnight.  MAY HAVE CLEAR LIQUIDS FROM MIDNIGHT UP UNTIL 0730 AM THEN NOTHING UNTIL AFTER SURGERY!     CLEAR LIQUID DIET   Foods Allowed                                                                     Foods Excluded  Coffee and tea, regular and decaf                             liquids that you cannot  Plain Jell-O in any flavor                                             see through such as: Fruit ices (not with fruit pulp)                                     milk, soups, orange juice  Iced Popsicles                                    All solid food Carbonated beverages, regular and diet                                    Cranberry, grape and apple juices Sports drinks like Gatorade Lightly seasoned clear broth or consume(fat free) Sugar, honey syrup  Sample Menu Breakfast                                Lunch                                     Supper Cranberry juice                    Beef broth                            Chicken broth Jell-O                                     Grape juice  Apple juice Coffee or tea                        Jell-O                                      Popsicle                                                Coffee or tea                        Coffee or tea  _____________________________________________________________________    Take these medicines the morning of surgery with A SIP OF WATER:  Gabapentin (Neurontin), Pantoprazole (Protonix), use  Albuterol  inhaler if needed and bring it with you to the hospital,  OXYCODONE IF NEEDED                                You may not have any metal on your body including hair pins and              piercings  Do not wear jewelry, make-up, lotions, powders or perfumes, deodorant             Do not wear nail polish.  Do not shave  48 hours prior to surgery.              Do not bring valuables to the hospital. [place].  Contacts, dentures or bridgework may not be worn into surgery.  Leave suitcase in the car. After surgery it may be brought to your room.                  Please read over the following fact sheets you were given: _____________________________________________________________________  Use dial soap for showers           [facility] - Preparing for Surgery Before surgery, you can play an important role.  Because skin is not sterile, your skin needs to be as free of germs as possible.  You can reduce the number of germs on your skin by washing with dial soapsoap before surgery.   Please DO NOT use if you have an allergy to CHG or antibacterial soaps.  If your skin becomes reddened/irritated stop using the CHG and inform your nurse when you arrive at Short Stay. Do not shave (including legs and underarms) for at least 48 hours prior to the first dial shower.  You may shave your face/neck. Please follow these instructions carefully:  1.  Shower with  Dial Soap the night before surgery and the  morning of Surgery.  2.  If you choose to wash your hair, wash your hair first as usual with your  normal  shampoo.  3.  After you shampoo, rinse your hair and body thoroughly to remove the  shampoo.                           4.  Use dial  as you would any other liquid soap.  You can apply chg directly  to the skin and wash                       Gently with a scrungie or clean washcloth.  5.  Apply the dial  Soap to your body ONLY FROM THE NECK DOWN.   Do not use on face/ open                           Wound or  open sores. Avoid contact with eyes, ears mouth and genitals (private parts).                       Wash face,  Genitals (private parts) with your normal soap.             6.  Wash thoroughly, paying special attention to the area where your surgery  will be performed.  7.  Thoroughly rinse your body with warm water from the neck down.  8.  DO NOT shower/wash with your normal soap after using and rinsing off  the CHG Soap.                9.  Pat yourself dry with a clean towel.            10.  Wear clean pajamas.            11.  Place clean sheets on your bed the night of your first shower and do not  sleep with pets. Day of Surgery : Do not apply any lotions/deodorants the morning of surgery.  Please wear clean clothes to the hospital/surgery center.  FAILURE TO FOLLOW THESE INSTRUCTIONS MAY RESULT IN THE CANCELLATION OF YOUR SURGERY PATIENT SIGNATURE_________________________________  NURSE SIGNATURE__________________________________  ________________________________________________________________________   [name]  An incentive spirometer is a tool that can help keep your lungs clear and active. This tool measures how well you are filling your lungs with each breath. Taking long deep breaths may help reverse or decrease the chance of developing breathing (pulmonary) problems (especially infection) following:  A long period of time when you are unable to move or be active. BEFORE THE PROCEDURE   If the spirometer includes an indicator to show your best effort, your nurse or respiratory therapist will set it to a desired goal.  If possible, sit up straight or lean slightly forward. Try not to slouch.  Hold the incentive spirometer in an upright position. INSTRUCTIONS FOR USE  1. Sit on the edge of your bed if possible, or sit up as far as you can in bed or on a chair. 2. Hold the incentive spirometer in an upright position. 3. Breathe out normally. 4. Place the  mouthpiece in your mouth and seal your lips tightly around it. 5. Breathe in slowly and as deeply as possible, raising the piston or the ball toward the top of the column. 6. Hold your breath for 3-5 seconds or for as long as possible. Allow the piston or ball to fall to the bottom of the column. 7. Remove the mouthpiece from your mouth and breathe out normally. 8. Rest for a few seconds and repeat Steps 1 through 7 at least 10 times every 1-2 hours when you are awake. Take your time and take a few normal breaths between deep breaths. 9. The spirometer may include an indicator to show your best effort. Use the indicator as a goal to  work toward during each repetition. 10. After each set of 10 deep breaths, practice coughing to be sure your lungs are clear. If you have an incision (the cut made at the time of surgery), support your incision when coughing by placing a pillow or rolled up towels firmly against it. Once you are able to get out of bed, walk around indoors and cough well. You may stop using the incentive spirometer when instructed by your caregiver.  RISKS AND COMPLICATIONS  Take your time so you do not get dizzy or light-headed.  If you are in pain, you may need to take or ask for pain medication before doing incentive spirometry. It is harder to take a deep breath if you are having pain. AFTER USE  Rest and breathe slowly and easily.  It can be helpful to keep track of a log of your progress. Your caregiver can provide you with a simple table to help with this. If you are using the spirometer at home, follow these instructions: [place] IF:   You are having difficultly using the spirometer.  You have trouble using the spirometer as often as instructed.  Your pain medication is not giving enough relief while using the spirometer.  You develop fever of 100.5 F (38.1 C) or higher. SEEK IMMEDIATE MEDICAL CARE IF:   You cough up bloody sputum that had not been present  before.  You develop fever of 102 F (38.9 C) or greater.  You develop worsening pain at or near the incision site. MAKE SURE YOU:   Understand these instructions.  Will watch your condition.  Will get help right away if you are not doing well or get worse. Document Released: 11/15/2006 Document Revised: 09/27/2011 Document Reviewed: 01/16/2007 Exit[employer] Patient Information 2014 Exit[employer], [place].   ________________________________________________________________________  WHAT IS A BLOOD TRANSFUSION? Blood Transfusion Information  A transfusion is the replacement of blood or some of its parts. Blood is made up of multiple cells which provide different functions.  Red blood cells carry oxygen and are used for blood loss replacement.  White blood cells fight against infection.  Platelets control bleeding.  Plasma helps clot blood.  Other blood products are available for specialized needs, such as hemophilia or other clotting disorders. BEFORE THE TRANSFUSION  Who gives blood for transfusions?   Healthy volunteers who are fully evaluated to make sure their blood is safe. This is blood bank blood. Transfusion therapy is the safest it has ever been in the practice of medicine. Before blood is taken from a donor, a complete history is taken to make sure that person has no history of diseases nor engages in risky social behavior (examples are intravenous drug use or sexual activity with multiple partners). The donor's travel history is screened to minimize risk of transmitting infections, such as malaria. The donated blood is tested for signs of infectious diseases, such as HIV and hepatitis. The blood is then tested to be sure it is compatible with you in order to minimize the chance of a transfusion reaction. If you or a relative donates blood, this is often done in anticipation of surgery and is not appropriate for emergency situations. It takes many days to process the donated  blood. RISKS AND COMPLICATIONS Although transfusion therapy is very safe and saves many lives, the main dangers of transfusion include:   Getting an infectious disease.  Developing a transfusion reaction. This is an allergic reaction to something in the blood you were given. Every precaution is  taken to prevent this. The decision to have a blood transfusion has been considered carefully by your caregiver before blood is given. Blood is not given unless the benefits outweigh the risks. AFTER THE TRANSFUSION  Right after receiving a blood transfusion, you will usually feel much better and more energetic. This is especially true if your red blood cells have gotten low (anemic). The transfusion raises the level of the red blood cells which carry oxygen, and this usually causes an energy increase.  The nurse administering the transfusion will monitor you carefully for complications. HOME CARE INSTRUCTIONS  No special instructions are needed after a transfusion. You may find your energy is better. Speak with your caregiver about any limitations on activity for underlying diseases you may have. SEEK MEDICAL CARE IF:   Your condition is not improving after your transfusion.  You develop redness or irritation at the intravenous (IV) site. SEEK IMMEDIATE MEDICAL CARE IF:  Any of the following symptoms occur over the next 12 hours:  Shaking chills.  You have a temperature by mouth above 102 F (38.9 C), not controlled by medicine.  Chest, back, or muscle pain.  People around you feel you are not acting correctly or are confused.  Shortness of breath or difficulty breathing.  Dizziness and fainting.  You get a rash or develop hives.  You have a decrease in urine output.  Your urine turns a dark color or changes to pink, red, or brown. Any of the following symptoms occur over the next 10 days:  You have a temperature by mouth above 102 F (38.9 C), not controlled by  medicine.  Shortness of breath.  Weakness after normal activity.  The white part of the eye turns yellow (jaundice).  You have a decrease in the amount of urine or are urinating less often.  Your urine turns a dark color or changes to pink, red, or brown. Document Released: 07/02/2000 Document Revised: 09/27/2011 Document Reviewed: 02/19/2008 [facility] Patient Information 2014 [place], [place].  _______________________________________________________________________

## 2017-02-01 NOTE — Progress Notes (Signed)
12/16/2016- noted in EPIC-EKG and CXR

## 2017-02-01 NOTE — H&P (Signed)
[name] is an 61 y.o. female.   Chief Complaint: right knee pain HPI: The patient is a 61 year old female with the chief complaint of right knee pain. She had a right total knee arthroplasty done about 6 weeks ago. She had been progressing well. Unfortunately, she fell down some steps on 01/27/17. She developed increased right knee pain and decreased function. Ultrasound showed a torn quadriceps tendon.  Past Medical History:  Diagnosis Date  . Arthritis   . Arthritis pain   . Asthma   . Biliary dyskinesia   . Chronic kidney disease    uremic calcium deficiency  . Complication of anesthesia    Difficulty arousing  . Constipation    from ibs  . Diarrhea    occ from ibs  . GERD (gastroesophageal reflux disease)   . H/O hiatal hernia   . History of kidney stones   . Liver tumor (benign) 05/2016  . PONV (postoperative nausea and vomiting)   . Rupture of right quadriceps tendon 01/26/2017  . Spondylolisthesis of lumbosacral region     Past Surgical History:  Procedure Laterality Date  . ABDOMINAL HYSTERECTOMY     partial  . CHOLECYSTECTOMY  10/08/2011   Procedure: LAPAROSCOPIC CHOLECYSTECTOMY WITH INTRAOPERATIVE CHOLANGIOGRAM;  Surgeon: Odis Hollingshead, MD;  Location: [facility];  Service: General;  Laterality: N/A;  . KNEE ARTHROSCOPY Right 03/2016  . TOTAL KNEE ARTHROPLASTY Right 12/22/2016   Procedure: RIGHT TOTAL KNEE ARTHROPLASTY;  Surgeon: Latanya Maudlin, MD;  Location: [facility];  Service: Orthopedics;  Laterality: Right;  . TUBAL LIGATION    . VEIN LIGATION AND STRIPPING      Family History  Problem Relation Age of Onset  . Cancer Mother        colon  . Heart disease Father   . Cancer Sister        lung/squamous cell   Social History:  reports that she has quit smoking. She has never used smokeless tobacco. She reports that she drinks about 0.6 oz of alcohol per week . She reports that she does not use drugs.  Allergies:  Allergies  Allergen Reactions  . Morphine  And Related Other (See Comments)    Low b/p.Karma Greaser...hallucinations  Pt was hospitalized due to reaction.  Marland Kitchen Penicillins Anaphylaxis    Has patient had a PCN reaction causing immediate rash, facial/tongue/throat swelling, SOB or lightheadedness with hypotension: Yes Has patient had a PCN reaction causing severe rash involving mucus membranes or skin necrosis: No Has patient had a PCN reaction that required hospitalization: No Has patient had a PCN reaction occurring within the last 10 years: No If all of the above answers are "NO", then may proceed with Cephalosporin use.   . Chlorhexidine Hives and Itching  . Latex Itching and Other (See Comments)    Blistering  . Erythromycin Hives and Rash     Current Outpatient Prescriptions:  .  aspirin EC 325 MG tablet, Take 1 tablet (325 mg total) by mouth 2 (two) times daily. (Patient taking differently: Take 325 mg by mouth daily. ), Disp: 60 tablet, Rfl: 0 .  cyclobenzaprine (FLEXERIL) 10 MG tablet, Take 1 tablet (10 mg total) by mouth 3 (three) times daily as needed for muscle spasms., Disp: 60 tablet, Rfl: 0 .  gabapentin (NEURONTIN) 300 MG capsule, Take 300 mg by mouth daily. , Disp: , Rfl:  .  metoCLOPramide (REGLAN) 10 MG tablet, Take 1 tablet (10 mg total) by mouth every 8 (eight) hours as  needed for nausea., Disp: 30 tablet, Rfl: 0 .  oxyCODONE-acetaminophen (PERCOCET) 10-325 MG tablet, Take 1 tablet by mouth every 4 (four) hours as needed. For pain., Disp: , Rfl: 0 .  pantoprazole (PROTONIX) 40 MG tablet, Take 40 mg by mouth daily., Disp: , Rfl: 5 .  VENTOLIN HFA 108 (90 Base) MCG/ACT inhaler, Inhale 1-2 puffs into the lungs every 6 (six) hours as needed for wheezing or shortness of breath., Disp: , Rfl:  .  HYDROcodone-acetaminophen (NORCO) 7.5-325 MG tablet, Take 1-2 tablets by mouth every 4 (four) hours as needed for severe pain. (Patient not taking: Reported on 01/31/2017), Disp: 60 tablet, Rfl: 0 .  oxyCODONE (OXY IR/ROXICODONE) 5 MG  immediate release tablet, Take 1-2 tablets (5-10 mg total) by mouth every 4 (four) hours as needed for severe pain. (Patient not taking: Reported on 01/31/2017), Disp: 60 tablet, Rfl: 0  Results for orders placed or performed during the hospital encounter of 02/01/17 (from the past 48 hour(s))  Urinalysis, Routine w reflex microscopic     Status: None   Collection Time: 02/01/17 10:58 AM  Result Value Ref Range   Color, Urine YELLOW YELLOW   APPearance CLEAR CLEAR   Specific Gravity, Urine 1.021 1.005 - 1.030   pH 5.0 5.0 - 8.0   Glucose, UA NEGATIVE NEGATIVE mg/dL   Hgb urine dipstick NEGATIVE NEGATIVE   Bilirubin Urine NEGATIVE NEGATIVE   Ketones, ur NEGATIVE NEGATIVE mg/dL   Protein, ur NEGATIVE NEGATIVE mg/dL   Nitrite NEGATIVE NEGATIVE   Leukocytes, UA NEGATIVE NEGATIVE  APTT     Status: None   Collection Time: 02/01/17 11:06 AM  Result Value Ref Range   aPTT 25 24 - 36 seconds  CBC WITH DIFFERENTIAL     Status: Abnormal   Collection Time: 02/01/17 11:06 AM  Result Value Ref Range   WBC 5.5 4.0 - 10.5 K/uL   RBC 3.95 3.87 - 5.11 MIL/uL   Hemoglobin 11.7 (L) 12.0 - 15.0 g/dL   HCT 35.9 (L) 36.0 - 46.0 %   MCV 90.9 78.0 - 100.0 fL   MCH 29.6 26.0 - 34.0 pg   MCHC 32.6 30.0 - 36.0 g/dL   RDW 13.0 11.5 - 15.5 %   Platelets 284 150 - 400 K/uL   Neutrophils Relative % 60 %   Neutro Abs 3.3 1.7 - 7.7 K/uL   Lymphocytes Relative 30 %   Lymphs Abs 1.6 0.7 - 4.0 K/uL   Monocytes Relative 7 %   Monocytes Absolute 0.4 0.1 - 1.0 K/uL   Eosinophils Relative 3 %   Eosinophils Absolute 0.2 0.0 - 0.7 K/uL   Basophils Relative 0 %   Basophils Absolute 0.0 0.0 - 0.1 K/uL  Comprehensive metabolic panel     Status: None   Collection Time: 02/01/17 11:06 AM  Result Value Ref Range   Sodium 140 135 - 145 mmol/L   Potassium 3.9 3.5 - 5.1 mmol/L   Chloride 106 101 - 111 mmol/L   CO2 26 22 - 32 mmol/L   Glucose, Bld 98 65 - 99 mg/dL   BUN 11 6 - 20 mg/dL   Creatinine, Ser 0.70 0.44 -  1.00 mg/dL   Calcium 9.0 8.9 - 10.3 mg/dL   Total Protein 7.4 6.5 - 8.1 g/dL   Albumin 4.3 3.5 - 5.0 g/dL   AST 25 15 - 41 U/L   ALT 21 14 - 54 U/L   Alkaline Phosphatase 73 38 - 126 U/L   Total Bilirubin  0.4 0.3 - 1.2 mg/dL   GFR calc non Af Amer >60 >60 mL/min   GFR calc Af Amer >60 >60 mL/min    Comment: (NOTE) The eGFR has been calculated using the CKD EPI equation. This calculation has not been validated in all clinical situations. eGFR's persistently <60 mL/min signify possible Chronic Kidney Disease.    Anion gap 8 5 - 15  Protime-INR     Status: None   Collection Time: 02/01/17 11:06 AM  Result Value Ref Range   Prothrombin Time 13.0 11.4 - 15.2 seconds   INR 0.98   Type and screen Order type and screen if day of surgery is less than 15 days from draw of preadmission visit or order morning of surgery if day of surgery is greater than 6 days from preadmission visit.     Status: None   Collection Time: 02/01/17 11:06 AM  Result Value Ref Range   ABO/RH(D) A POS    Antibody Screen NEG    Sample Expiration 02/15/2017    Extend sample reason NO TRANSFUSIONS OR PREGNANCY IN THE PAST 3 MONTHS     Review of Systems  Constitutional: Positive for malaise/fatigue. Negative for chills, diaphoresis, fever and weight loss.  HENT: Negative.   Eyes: Negative.   Respiratory: Negative.   Cardiovascular: Negative.   Gastrointestinal: Negative.   Genitourinary: Negative.   Musculoskeletal: Positive for falls, joint pain and myalgias. Negative for back pain and neck pain.  Skin: Negative.   Neurological: Negative.  Negative for weakness.  Endo/Heme/Allergies: Negative.   Psychiatric/Behavioral: Negative for depression, hallucinations, memory loss, substance abuse and suicidal ideas. The patient is nervous/anxious. The patient does not have insomnia.     Last menstrual period 08/19/2011. Physical Exam  Constitutional: She is oriented to person, place, and time. She appears  well-developed and well-nourished. No distress.  HENT:  Head: Normocephalic and atraumatic.  Right Ear: External ear normal.  Left Ear: External ear normal.  Nose: Nose normal.  Mouth/Throat: Oropharynx is clear and moist.  Eyes: Conjunctivae and EOM are normal.  Neck: Normal range of motion. Neck supple.  Cardiovascular: Normal rate, regular rhythm, normal heart sounds and intact distal pulses.   No murmur heard. Respiratory: Effort normal and breath sounds normal. No respiratory distress. She has no wheezes.  GI: Soft. Bowel sounds are normal.  Musculoskeletal:       Right hip: Normal.       Left hip: Normal.       Right knee: She exhibits decreased range of motion, swelling and effusion. She exhibits no erythema. Tenderness found.       Left knee: Normal.  Palpable defect over quad tendon. Unable to perform straight leg raise or extend knee.   Neurological: She is oriented to person, place, and time. She has normal strength. No sensory deficit.  Skin: No rash noted. She is not diaphoretic. No erythema.  Psychiatric: She has a normal mood and affect. Her behavior is normal.     Vitals  Weight: 163 lb Height: 65in Pulse: 80 (Regular)  BP: 134/80 (Sitting, Left Arm, Standard)   Assessment/Plan Right quadriceps tendon tear She needs an open right quadriceps tendon repair with graft. Risks and benefits of the procedure discussed with the patient by Dr. Latanya Maudlin.   Vernice Bowker LAUREN, PA-C 02/01/2017, 2:07 PM

## 2017-02-03 ENCOUNTER — Ambulatory Visit (HOSPITAL_COMMUNITY): Payer: 59 | Admitting: Anesthesiology

## 2017-02-03 ENCOUNTER — Observation Stay (HOSPITAL_COMMUNITY)
Admission: AD | Admit: 2017-02-03 | Discharge: 2017-02-05 | Disposition: A | Discharge status: Home / Self Care | Payer: 59 | Source: Ambulatory Visit | Attending: Orthopedic Surgery | Admitting: Orthopedic Surgery

## 2017-02-03 ENCOUNTER — Encounter (HOSPITAL_COMMUNITY): Payer: Self-pay | Admitting: *Deleted

## 2017-02-03 ENCOUNTER — Encounter (HOSPITAL_COMMUNITY)
Admission: AD | Disposition: A | Discharge status: Home / Self Care | Payer: Self-pay | Source: Ambulatory Visit | Attending: Orthopedic Surgery

## 2017-02-03 DIAGNOSIS — Z7982 Long term (current) use of aspirin: Secondary | ICD-10-CM | POA: Insufficient documentation

## 2017-02-03 DIAGNOSIS — Z96651 Presence of right artificial knee joint: Secondary | ICD-10-CM | POA: Insufficient documentation

## 2017-02-03 DIAGNOSIS — S76111A Strain of right quadriceps muscle, fascia and tendon, initial encounter: Principal | ICD-10-CM | POA: Diagnosis present

## 2017-02-03 DIAGNOSIS — Z87891 Personal history of nicotine dependence: Secondary | ICD-10-CM | POA: Insufficient documentation

## 2017-02-03 DIAGNOSIS — W109XXA Fall (on) (from) unspecified stairs and steps, initial encounter: Secondary | ICD-10-CM | POA: Insufficient documentation

## 2017-02-03 DIAGNOSIS — K219 Gastro-esophageal reflux disease without esophagitis: Secondary | ICD-10-CM | POA: Insufficient documentation

## 2017-02-03 DIAGNOSIS — N189 Chronic kidney disease, unspecified: Secondary | ICD-10-CM | POA: Insufficient documentation

## 2017-02-03 DIAGNOSIS — Z79899 Other long term (current) drug therapy: Secondary | ICD-10-CM | POA: Insufficient documentation

## 2017-02-03 DIAGNOSIS — J45909 Unspecified asthma, uncomplicated: Secondary | ICD-10-CM | POA: Insufficient documentation

## 2017-02-03 HISTORY — PX: REPAIR QUADRICEPS/HAMSTRING MUSCLES: SHX6563

## 2017-02-03 LAB — TYPE AND SCREEN
ABO/RH(D): A POS
Antibody Screen: NEGATIVE

## 2017-02-03 SURGERY — REPAIR, MUSCLE, QUADRICEPS OR HAMSTRING
Anesthesia: General | Site: Knee | Laterality: Right

## 2017-02-03 MED ORDER — 0.9 % SODIUM CHLORIDE (POUR BTL) OPTIME
TOPICAL | Status: DC | PRN
  Administered 2017-02-03: 1000 mL

## 2017-02-03 MED ORDER — HYDROCODONE-ACETAMINOPHEN 7.5-325 MG PO TABS: 1.0000 | ORAL_TABLET | ORAL | Status: DC | PRN

## 2017-02-03 MED ORDER — MIDAZOLAM HCL 5 MG/5ML IJ SOLN
INTRAMUSCULAR | Status: DC | PRN
  Administered 2017-02-03: 2 mg via INTRAVENOUS

## 2017-02-03 MED ORDER — ALUM & MAG HYDROXIDE-SIMETH 200-200-20 MG/5ML PO SUSP: 30.0000 mL | ORAL | Status: DC | PRN

## 2017-02-03 MED ORDER — LACTATED RINGERS IV SOLN
INTRAVENOUS | Status: DC
  Administered 2017-02-03 (×3): via INTRAVENOUS

## 2017-02-03 MED ORDER — OXYCODONE-ACETAMINOPHEN 5-325 MG PO TABS
1.0000 | ORAL_TABLET | ORAL | Status: DC | PRN
  Administered 2017-02-03 – 2017-02-05 (×10): 1 via ORAL
  Filled 2017-02-03 (×10): qty 1

## 2017-02-03 MED ORDER — HYDROMORPHONE HCL-NACL 0.5-0.9 MG/ML-% IV SOSY
0.5000 mg | PREFILLED_SYRINGE | INTRAVENOUS | Status: DC | PRN
  Administered 2017-02-04: 0.5 mg via INTRAVENOUS
  Filled 2017-02-03: qty 1

## 2017-02-03 MED ORDER — PROPOFOL 10 MG/ML IV BOLUS
INTRAVENOUS | Status: AC
  Filled 2017-02-03: qty 20

## 2017-02-03 MED ORDER — ACETAMINOPHEN 325 MG PO TABS: 650.0000 mg | ORAL_TABLET | Freq: Four times a day (QID) | ORAL | Status: DC | PRN

## 2017-02-03 MED ORDER — VANCOMYCIN HCL IN DEXTROSE 1-5 GM/200ML-% IV SOLN
1000.0000 mg | Freq: Two times a day (BID) | INTRAVENOUS | Status: AC
  Administered 2017-02-03: 1000 mg via INTRAVENOUS
  Filled 2017-02-03: qty 200

## 2017-02-03 MED ORDER — ONDANSETRON HCL 4 MG/2ML IJ SOLN
INTRAMUSCULAR | Status: DC | PRN
  Administered 2017-02-03: 4 mg via INTRAVENOUS

## 2017-02-03 MED ORDER — ACETAMINOPHEN 650 MG RE SUPP: 650.0000 mg | Freq: Four times a day (QID) | RECTAL | Status: DC | PRN

## 2017-02-03 MED ORDER — MIDAZOLAM HCL 2 MG/2ML IJ SOLN
INTRAMUSCULAR | Status: AC
  Administered 2017-02-03: 2 mg via INTRAVENOUS
  Filled 2017-02-03: qty 2

## 2017-02-03 MED ORDER — SUCCINYLCHOLINE CHLORIDE 200 MG/10ML IV SOSY
PREFILLED_SYRINGE | INTRAVENOUS | Status: DC | PRN
  Administered 2017-02-03: 120 mg via INTRAVENOUS

## 2017-02-03 MED ORDER — SUGAMMADEX SODIUM 200 MG/2ML IV SOLN
INTRAVENOUS | Status: DC | PRN
  Administered 2017-02-03: 200 mg via INTRAVENOUS

## 2017-02-03 MED ORDER — GABAPENTIN 300 MG PO CAPS
300.0000 mg | ORAL_CAPSULE | Freq: Every day | ORAL | Status: DC
  Administered 2017-02-04 – 2017-02-05 (×2): 300 mg via ORAL
  Filled 2017-02-03 (×2): qty 1

## 2017-02-03 MED ORDER — FENTANYL CITRATE (PF) 100 MCG/2ML IJ SOLN
INTRAMUSCULAR | Status: AC
  Filled 2017-02-03: qty 2

## 2017-02-03 MED ORDER — ROCURONIUM BROMIDE 10 MG/ML (PF) SYRINGE
PREFILLED_SYRINGE | INTRAVENOUS | Status: DC | PRN
  Administered 2017-02-03 (×2): 10 mg via INTRAVENOUS
  Administered 2017-02-03: 30 mg via INTRAVENOUS

## 2017-02-03 MED ORDER — FENTANYL CITRATE (PF) 100 MCG/2ML IJ SOLN
INTRAMUSCULAR | Status: AC
  Administered 2017-02-03: 100 ug via INTRAVENOUS
  Filled 2017-02-03: qty 2

## 2017-02-03 MED ORDER — BUPIVACAINE LIPOSOME 1.3 % IJ SUSP
INTRAMUSCULAR | Status: DC | PRN
  Administered 2017-02-03: 20 mL

## 2017-02-03 MED ORDER — OXYCODONE HCL 5 MG PO TABS
5.0000 mg | ORAL_TABLET | ORAL | Status: DC | PRN
  Administered 2017-02-03 – 2017-02-05 (×10): 5 mg via ORAL
  Filled 2017-02-03 (×10): qty 1

## 2017-02-03 MED ORDER — ONDANSETRON HCL 4 MG PO TABS
4.0000 mg | ORAL_TABLET | Freq: Four times a day (QID) | ORAL | Status: DC | PRN
Start: 2017-02-03 — End: 2017-02-05

## 2017-02-03 MED ORDER — RIVAROXABAN 10 MG PO TABS
10.0000 mg | ORAL_TABLET | Freq: Every day | ORAL | Status: DC
  Administered 2017-02-04 – 2017-02-05 (×2): 10 mg via ORAL
  Filled 2017-02-03 (×2): qty 1

## 2017-02-03 MED ORDER — SODIUM CHLORIDE 0.9 % IV SOLN
INTRAVENOUS | Status: AC
  Filled 2017-02-03: qty 500000

## 2017-02-03 MED ORDER — PROMETHAZINE HCL 25 MG/ML IJ SOLN
6.2500 mg | INTRAMUSCULAR | Status: DC | PRN
  Administered 2017-02-03: 12.5 mg via INTRAVENOUS

## 2017-02-03 MED ORDER — PHENOL 1.4 % MT LIQD: 1.0000 | OROMUCOSAL | Status: DC | PRN

## 2017-02-03 MED ORDER — LACTATED RINGERS IV SOLN
INTRAVENOUS | Status: DC
  Administered 2017-02-03: 18:00:00 100 mL/h via INTRAVENOUS

## 2017-02-03 MED ORDER — BUPIVACAINE LIPOSOME 1.3 % IJ SUSP
20.0000 mL | Freq: Once | INTRAMUSCULAR | Status: DC
Start: 2017-02-03 — End: 2017-02-05
  Filled 2017-02-03: qty 20

## 2017-02-03 MED ORDER — MIDAZOLAM HCL 2 MG/2ML IJ SOLN
INTRAMUSCULAR | Status: AC
  Filled 2017-02-03: qty 2

## 2017-02-03 MED ORDER — MENTHOL 3 MG MT LOZG: 1.0000 | LOZENGE | OROMUCOSAL | Status: DC | PRN

## 2017-02-03 MED ORDER — POVIDONE-IODINE 7.5 % EX SOLN: Freq: Once | CUTANEOUS | Status: DC

## 2017-02-03 MED ORDER — FLEET ENEMA 7-19 GM/118ML RE ENEM: 1.0000 | ENEMA | Freq: Once | RECTAL | Status: DC | PRN

## 2017-02-03 MED ORDER — PANTOPRAZOLE SODIUM 40 MG PO TBEC
40.0000 mg | DELAYED_RELEASE_TABLET | Freq: Every day | ORAL | Status: DC
  Administered 2017-02-04 – 2017-02-05 (×2): 40 mg via ORAL
  Filled 2017-02-03 (×2): qty 1

## 2017-02-03 MED ORDER — CYCLOBENZAPRINE HCL 10 MG PO TABS
10.0000 mg | ORAL_TABLET | Freq: Three times a day (TID) | ORAL | Status: DC | PRN
Start: 2017-02-03 — End: 2017-02-05
  Administered 2017-02-04 – 2017-02-05 (×4): 10 mg via ORAL
  Filled 2017-02-03 (×4): qty 1

## 2017-02-03 MED ORDER — ONDANSETRON HCL 4 MG/2ML IJ SOLN: 4.0000 mg | Freq: Four times a day (QID) | INTRAMUSCULAR | Status: DC | PRN

## 2017-02-03 MED ORDER — BISACODYL 5 MG PO TBEC
5.0000 mg | DELAYED_RELEASE_TABLET | Freq: Every day | ORAL | Status: DC | PRN
Start: 2017-02-03 — End: 2017-02-05

## 2017-02-03 MED ORDER — LIDOCAINE 2% (20 MG/ML) 5 ML SYRINGE
INTRAMUSCULAR | Status: DC | PRN
  Administered 2017-02-03: 20 mg via INTRAVENOUS

## 2017-02-03 MED ORDER — POLYETHYLENE GLYCOL 3350 17 G PO PACK: 17.0000 g | PACK | Freq: Every day | ORAL | Status: DC | PRN

## 2017-02-03 MED ORDER — MIDAZOLAM HCL 2 MG/2ML IJ SOLN
2.0000 mg | Freq: Once | INTRAMUSCULAR | Status: AC
  Administered 2017-02-03: 2 mg via INTRAVENOUS

## 2017-02-03 MED ORDER — ALBUTEROL SULFATE (2.5 MG/3ML) 0.083% IN NEBU: 3.0000 mL | INHALATION_SOLUTION | Freq: Four times a day (QID) | RESPIRATORY_TRACT | Status: DC | PRN

## 2017-02-03 MED ORDER — VANCOMYCIN HCL IN DEXTROSE 1-5 GM/200ML-% IV SOLN
1000.0000 mg | INTRAVENOUS | Status: AC
  Administered 2017-02-03: 1000 mg via INTRAVENOUS
  Filled 2017-02-03: qty 200

## 2017-02-03 MED ORDER — SODIUM CHLORIDE 0.9 % IJ SOLN
INTRAMUSCULAR | Status: AC
  Filled 2017-02-03: qty 10

## 2017-02-03 MED ORDER — HYDROMORPHONE HCL-NACL 0.5-0.9 MG/ML-% IV SOSY
0.2500 mg | PREFILLED_SYRINGE | INTRAVENOUS | Status: DC | PRN
  Administered 2017-02-03 (×2): 0.5 mg via INTRAVENOUS

## 2017-02-03 MED ORDER — DEXAMETHASONE SODIUM PHOSPHATE 10 MG/ML IJ SOLN
INTRAMUSCULAR | Status: DC | PRN
  Administered 2017-02-03: 10 mg via INTRAVENOUS

## 2017-02-03 MED ORDER — SODIUM CHLORIDE 0.9 % IJ SOLN
INTRAMUSCULAR | Status: DC | PRN
  Administered 2017-02-03: 10 mL

## 2017-02-03 MED ORDER — PROMETHAZINE HCL 25 MG/ML IJ SOLN
INTRAMUSCULAR | Status: AC
  Filled 2017-02-03: qty 1

## 2017-02-03 MED ORDER — PROPOFOL 10 MG/ML IV BOLUS
INTRAVENOUS | Status: DC | PRN
Start: 2017-02-03 — End: 2017-02-03
  Administered 2017-02-03: 200 mg via INTRAVENOUS

## 2017-02-03 MED ORDER — PROPOFOL 500 MG/50ML IV EMUL
INTRAVENOUS | Status: DC | PRN
  Administered 2017-02-03: 25 ug/kg/min via INTRAVENOUS

## 2017-02-03 MED ORDER — SODIUM CHLORIDE 0.9 % IV SOLN
INTRAVENOUS | Status: DC | PRN
  Administered 2017-02-03: 500 mL

## 2017-02-03 MED ORDER — SCOPOLAMINE 1 MG/3DAYS TD PT72
1.0000 | MEDICATED_PATCH | Freq: Once | TRANSDERMAL | Status: AC
  Administered 2017-02-03: 1 via TRANSDERMAL
  Filled 2017-02-03: qty 1

## 2017-02-03 MED ORDER — FENTANYL CITRATE (PF) 100 MCG/2ML IJ SOLN
100.0000 ug | Freq: Once | INTRAMUSCULAR | Status: AC
  Administered 2017-02-03: 100 ug via INTRAVENOUS

## 2017-02-03 MED ORDER — FENTANYL CITRATE (PF) 100 MCG/2ML IJ SOLN
INTRAMUSCULAR | Status: DC | PRN
  Administered 2017-02-03 (×2): 50 ug via INTRAVENOUS
  Administered 2017-02-03: 100 ug via INTRAVENOUS
  Administered 2017-02-03 (×2): 50 ug via INTRAVENOUS

## 2017-02-03 MED ORDER — METOCLOPRAMIDE HCL 10 MG PO TABS: 10.0000 mg | ORAL_TABLET | Freq: Three times a day (TID) | ORAL | Status: DC | PRN

## 2017-02-03 MED ORDER — OXYCODONE-ACETAMINOPHEN 10-325 MG PO TABS: 1.0000 | ORAL_TABLET | ORAL | Status: DC | PRN

## 2017-02-03 MED ORDER — HYDROMORPHONE HCL-NACL 0.5-0.9 MG/ML-% IV SOSY
PREFILLED_SYRINGE | INTRAVENOUS | Status: AC
  Filled 2017-02-03: qty 2

## 2017-02-03 MED ORDER — ROPIVACAINE HCL 5 MG/ML IJ SOLN
INTRAMUSCULAR | Status: DC | PRN
  Administered 2017-02-03: 30 mL via PERINEURAL

## 2017-02-03 SURGICAL SUPPLY — 57 items
BAG SPEC THK2 15X12 ZIP CLS (MISCELLANEOUS) ×1
BAG ZIPLOCK 12X15 (MISCELLANEOUS) ×3 IMPLANT
BANDAGE ACE 4X5 VEL STRL LF (GAUZE/BANDAGES/DRESSINGS) ×3 IMPLANT
BANDAGE ACE 6X5 VEL STRL LF (GAUZE/BANDAGES/DRESSINGS) ×3 IMPLANT
BANDAGE ELASTIC 4 VELCRO ST LF (GAUZE/BANDAGES/DRESSINGS) ×3 IMPLANT
BANDAGE ELASTIC 6 VELCRO ST LF (GAUZE/BANDAGES/DRESSINGS) ×3 IMPLANT
BIT DRILL 2.0X128 (BIT) ×2 IMPLANT
BIT DRILL 2.0X128MM (BIT) ×1
BIT DRILL 2.8X128 (BIT) ×2 IMPLANT
BIT DRILL 2.8X128MM (BIT) ×1
BNDG GAUZE ELAST 4 BULKY (GAUZE/BANDAGES/DRESSINGS) ×3 IMPLANT
COVER SURGICAL LIGHT HANDLE (MISCELLANEOUS) ×3 IMPLANT
CUFF TOURN SGL QUICK 34 (TOURNIQUET CUFF) ×3
CUFF TRNQT CYL 34X4X40X1 (TOURNIQUET CUFF) ×1 IMPLANT
DERMASPAN .5-.9MM 4X4CM SHOU (Miscellaneous) ×3 IMPLANT
DRAPE EXTREMITY T 121X128X90 (DRAPE) ×3 IMPLANT
DRAPE U-SHAPE 47X51 STRL (DRAPES) ×3 IMPLANT
DRSG ADAPTIC 3X8 NADH LF (GAUZE/BANDAGES/DRESSINGS) ×3 IMPLANT
DRSG PAD ABDOMINAL 8X10 ST (GAUZE/BANDAGES/DRESSINGS) ×9 IMPLANT
DURAPREP 26ML APPLICATOR (WOUND CARE) ×3 IMPLANT
ELECT REM PT RETURN 15FT ADLT (MISCELLANEOUS) ×3 IMPLANT
GAUZE SPONGE 4X4 12PLY STRL (GAUZE/BANDAGES/DRESSINGS) ×3 IMPLANT
GLOVE BIOGEL PI IND STRL 8.5 (GLOVE) ×1 IMPLANT
GLOVE BIOGEL PI INDICATOR 8.5 (GLOVE) ×2
GLOVE ECLIPSE 8.0 STRL XLNG CF (GLOVE) ×6 IMPLANT
GOWN STRL REUS W/TWL LRG LVL3 (GOWN DISPOSABLE) ×9 IMPLANT
GOWN STRL REUS W/TWL XL LVL3 (GOWN DISPOSABLE) ×6 IMPLANT
IMMOBILIZER KNEE 20 (SOFTGOODS) ×6 IMPLANT
IMMOBILIZER KNEE 20 THIGH 36 (SOFTGOODS) ×1 IMPLANT
KIT BASIN OR (CUSTOM PROCEDURE TRAY) ×3 IMPLANT
MANIFOLD NEPTUNE II (INSTRUMENTS) ×3 IMPLANT
NEEDLE HYPO 22GX1.5 SAFETY (NEEDLE) ×3 IMPLANT
NEEDLE MA TROC 1/2 CIR (NEEDLE) ×3 IMPLANT
NEEDLE SPNL 18GX3.5 QUINCKE PK (NEEDLE) ×3 IMPLANT
NS IRRIG 1000ML POUR BTL (IV SOLUTION) ×3 IMPLANT
PACK TOTAL JOINT (CUSTOM PROCEDURE TRAY) ×3 IMPLANT
PAD ABD 8X10 STRL (GAUZE/BANDAGES/DRESSINGS) ×3 IMPLANT
PASSER SUT SWANSON 36MM LOOP (INSTRUMENTS) ×3 IMPLANT
POSITIONER SURGICAL ARM (MISCELLANEOUS) ×3 IMPLANT
RETRIEVER SUT HEWSON (MISCELLANEOUS) ×3 IMPLANT
SPONGE LAP 18X18 X RAY DECT (DISPOSABLE) ×3 IMPLANT
STAPLER VISISTAT 35W (STAPLE) ×3 IMPLANT
STOCKINETTE 8 INCH (MISCELLANEOUS) ×3 IMPLANT
SUT ETHIBOND 2 OS 4 DA (SUTURE) ×6 IMPLANT
SUT ETHIBOND 5 LR DA (SUTURE) ×9 IMPLANT
SUT ETHIBOND NAB CT1 #1 30IN (SUTURE) ×6 IMPLANT
SUT FIBERWIRE #2 38 T-5 BLUE (SUTURE)
SUT VIC AB 0 CT1 27 (SUTURE) ×9
SUT VIC AB 0 CT1 27XBRD ANTBC (SUTURE) ×3 IMPLANT
SUT VIC AB 1 CT1 27 (SUTURE)
SUT VIC AB 1 CT1 27XBRD ANTBC (SUTURE) IMPLANT
SUT VIC AB 2-0 CT1 27 (SUTURE)
SUT VIC AB 2-0 CT1 TAPERPNT 27 (SUTURE) IMPLANT
SUTURE FIBERWR #2 38 T-5 BLUE (SUTURE) IMPLANT
SYR 30ML LL (SYRINGE) ×3 IMPLANT
TOWEL OR 17X26 10 PK STRL BLUE (TOWEL DISPOSABLE) ×6 IMPLANT
WATER STERILE IRR 1500ML POUR (IV SOLUTION) ×3 IMPLANT

## 2017-02-03 NOTE — Transfer of Care (Signed)
Immediate Anesthesia Transfer of Care Note  Patient: [name]  Procedure(s) Performed: Procedure(s): REPAIR QUADRICEPS WITH GRAFT (Right)  Patient Location: PACU  Anesthesia Type:General  Level of Consciousness:  sedated, patient cooperative and responds to stimulation  Airway & Oxygen Therapy:Patient Spontanous Breathing and Patient connected to face mask oxgen  Post-op Assessment:  Report given to PACU RN and Post -op Vital signs reviewed and stable  Post vital signs:  Reviewed and stable  Last Vitals:  Vitals:   02/03/17 1305 02/03/17 1310  BP: 121/81 123/83  Pulse: 92 88  Resp: 19 16  Temp:      Complications: No apparent anesthesia complications

## 2017-02-03 NOTE — Anesthesia Preprocedure Evaluation (Signed)
Anesthesia Evaluation  Patient identified by MRN, date of birth, ID band Patient awake    Reviewed: Allergy & Precautions, NPO status , Patient's Chart, lab work & pertinent test results  History of Anesthesia Complications (+) PONV  Airway Mallampati: II  TM Distance: >3 FB Neck ROM: Full    Dental no notable dental hx.    Pulmonary neg pulmonary ROS, former smoker,    Pulmonary exam normal breath sounds clear to auscultation       Cardiovascular negative cardio ROS Normal cardiovascular exam Rhythm:Regular Rate:Normal     Neuro/Psych negative neurological ROS  negative psych ROS   GI/Hepatic negative GI ROS, Neg liver ROS, hiatal hernia, GERD  ,  Endo/Other  negative endocrine ROS  Renal/GU negative Renal ROS  negative genitourinary   Musculoskeletal negative musculoskeletal ROS (+)   Abdominal   Peds negative pediatric ROS (+)  Hematology negative hematology ROS (+)   Anesthesia Other Findings   Reproductive/Obstetrics negative OB ROS                             Lab Results  Component Value Date   WBC 5.5 02/01/2017   HGB 11.7 (L) 02/01/2017   HCT 35.9 (L) 02/01/2017   MCV 90.9 02/01/2017   PLT 284 02/01/2017   Lab Results  Component Value Date   CREATININE 0.70 02/01/2017   BUN 11 02/01/2017   NA 140 02/01/2017   K 3.9 02/01/2017   CL 106 02/01/2017   CO2 26 02/01/2017    Anesthesia Physical  Anesthesia Plan  ASA: II  Anesthesia Plan: General   Post-op Pain Management:  Regional for Post-op pain   Induction: Intravenous  PONV Risk Score and Plan: 4 or greater and Ondansetron, Dexamethasone, Propofol, Midazolam, Scopolamine patch - Pre-op and Treatment may vary due to age or medical condition  Airway Management Planned: Oral ETT  Additional Equipment:   Intra-op Plan:   Post-operative Plan: Extubation in OR  Informed Consent: I have reviewed the  patients History and Physical, chart, labs and discussed the procedure including the risks, benefits and alternatives for the proposed anesthesia with the patient or authorized representative who has indicated his/her understanding and acceptance.   Dental advisory given  Plan Discussed with: CRNA  Anesthesia Plan Comments:         Anesthesia Quick Evaluation

## 2017-02-03 NOTE — Anesthesia Procedure Notes (Signed)
Procedure Name: Intubation Date/Time: 02/03/2017 1:58 PM Performed by: Winna Golla, Virgel Gess Pre-anesthesia Checklist: Patient identified, Emergency Drugs available, Suction available, Patient being monitored and Timeout performed Patient Re-evaluated:Patient Re-evaluated prior to induction Oxygen Delivery Method: Circle system utilized Preoxygenation: Pre-oxygenation with 100% oxygen Induction Type: IV induction Ventilation: Mask ventilation without difficulty Laryngoscope Size: Mac and 4 Grade View: Grade II Tube type: Oral Tube size: 7.5 mm Number of attempts: 1 Airway Equipment and Method: Stylet Placement Confirmation: ETT inserted through vocal cords under direct vision,  positive ETCO2,  CO2 detector and breath sounds checked- equal and bilateral Secured at: 21 cm Tube secured with: Tape Dental Injury: Teeth and Oropharynx as per pre-operative assessment and Injury to lip  Comments: Small laceration to lip by Morey Hummingbird Locke.

## 2017-02-03 NOTE — Anesthesia Postprocedure Evaluation (Signed)
Anesthesia Post Note  Patient: [name]  Procedure(s) Performed: Procedure(s) (LRB): REPAIR QUADRICEPS WITH GRAFT (Right)     Patient location during evaluation: PACU Anesthesia Type: General Level of consciousness: awake and alert Pain management: pain level controlled Vital Signs Assessment: post-procedure vital signs reviewed and stable Respiratory status: spontaneous breathing, nonlabored ventilation, respiratory function stable and patient connected to nasal cannula oxygen Cardiovascular status: blood pressure returned to baseline and stable Postop Assessment: no signs of nausea or vomiting Anesthetic complications: no    Last Vitals:  Vitals:   02/03/17 1700 02/03/17 1710  BP: 135/90 135/87  Pulse: 87 84  Resp: 16 19  Temp:  36.6 C    Last Pain:  Vitals:   02/03/17 1710  TempSrc:   PainSc: 4           RLE Sensation: Decreased (02/03/17 1710)      Tiajuana Amass

## 2017-02-03 NOTE — Progress Notes (Signed)
Orthopedic Tech Progress Note Patient Details:  [name] 11/23/1955 [id number]  Pt already has Knee Immobilizer. Patient ID: [name], female   DOB: 1956/07/13, 61 y.o.   MRN: [id number]   [name] 02/03/2017, 6:23 PM

## 2017-02-03 NOTE — Progress Notes (Signed)
Assisted Dr. Rob Fitzgerald with right, ultrasound guided, femoral block. Side rails up, monitors on throughout procedure. See vital signs in flow sheet. Tolerated Procedure well. 

## 2017-02-03 NOTE — Anesthesia Procedure Notes (Signed)
Anesthesia Regional Block: Femoral nerve block   Pre-Anesthetic Checklist: ,, timeout performed, Correct Patient, Correct Site, Correct Laterality, Correct Procedure, Correct Position, site marked, Risks and benefits discussed,  Surgical consent,  Pre-op evaluation,  At surgeon's request and post-op pain management  Laterality: Right  Prep: chloraprep       Needles:  Injection technique: Single-shot  Needle Type: Echogenic Needle     Needle Length: 9cm  Needle Gauge: 21     Additional Needles:   Procedures: ultrasound guided,,,,,,,,  Narrative:  Start time: 02/03/2017 12:55 PM End time: 02/03/2017 1:03 PM Injection made incrementally with aspirations every 5 mL.  Performed by: Personally  Anesthesiologist: Suzette Battiest

## 2017-02-03 NOTE — Interval H&P Note (Signed)
History and Physical Interval Note:  02/03/2017 1:38 PM  [name]  has presented today for surgery, with the diagnosis of Right quad tendon tear  The various methods of treatment have been discussed with the patient and family. After consideration of risks, benefits and other options for treatment, the patient has consented to  Procedure(s): REPAIR QUADRICEPS WITH GRAFT (Right) as a surgical intervention .  The patient's history has been reviewed, patient examined, no change in status, stable for surgery.  I have reviewed the patient's chart and labs.  Questions were answered to the patient's satisfaction.     Lorieann Argueta A

## 2017-02-03 NOTE — Brief Op Note (Signed)
02/03/2017  3:24 PM  PATIENT:  [name]  61 y.o. female  PRE-OPERATIVE DIAGNOSIS:  Right quad tendon tear  POST-OPERATIVE DIAGNOSIS:  Right quad tendon tear  PROCEDURE:  Procedure(s): REPAIR QUADRICEPS WITH GRAFT (Right)  SURGEON:  Surgeon(s) and Role:    Latanya Maudlin, MD - Primary    * Swinteck, Aaron Edelman, MD - Assisting  PHYSICIAN ASSISTANT:Amber Timnath PA   ASSISTANTS:Amber Benedict [place] and Rod Can MD   ANESTHESIA:   general  EBL:  Total I/O In: 800 [I.V.:800] Out: 50 [Blood:50]  BLOOD ADMINISTERED:none  DRAINS: none   LOCAL MEDICATIONS USED:  OTHER Exparel 20cc mixed with 20cc of Normal Saline. At start of case,Anesthesia performed a Femoral Nerve Block.  SPECIMEN:  No Specimen  DISPOSITION OF SPECIMEN:  N/A  COUNTS:  YES  TOURNIQUET:  * Missing tourniquet times found for documented tourniquets in log:  [id number] *  DICTATION: .Other Dictation: Dictation Number [id number]  PLAN OF CARE: Admit to inpatient   PATIENT DISPOSITION:  ICU - intubated and critically ill.   Delay start of Pharmacological VTE agent (>24hrs) due to surgical blood loss or risk of bleeding: yes

## 2017-02-04 DIAGNOSIS — S76111A Strain of right quadriceps muscle, fascia and tendon, initial encounter: Secondary | ICD-10-CM | POA: Diagnosis not present

## 2017-02-04 LAB — BASIC METABOLIC PANEL
ANION GAP: 9 (ref 5–15)
BUN: 11 mg/dL (ref 6–20)
CALCIUM: 9.1 mg/dL (ref 8.9–10.3)
CO2: 26 mmol/L (ref 22–32)
CREATININE: 0.76 mg/dL (ref 0.44–1.00)
Chloride: 107 mmol/L (ref 101–111)
GFR calc non Af Amer: >60 mL/min (ref >60)
Glucose, Bld: 138 mg/dL — ABNORMAL HIGH (ref 65–99)
Potassium: 3.9 mmol/L (ref 3.5–5.1)
SODIUM: 142 mmol/L (ref 135–145)

## 2017-02-04 LAB — CBC
HEMATOCRIT: 33.3 % — AB (ref 36.0–46.0)
HEMOGLOBIN: 11.1 g/dL — AB (ref 12.0–15.0)
MCH: 30.8 pg (ref 26.0–34.0)
MCHC: 33.3 g/dL (ref 30.0–36.0)
MCV: 92.5 fL (ref 78.0–100.0)
Platelets: 288 10*3/uL (ref 150–400)
RBC: 3.6 MIL/uL — ABNORMAL LOW (ref 3.87–5.11)
RDW: 12.9 % (ref 11.5–15.5)
WBC: 9.2 10*3/uL (ref 4.0–10.5)

## 2017-02-04 MED ORDER — CYCLOBENZAPRINE HCL 10 MG PO TABS: 10.0000 mg | ORAL_TABLET | Freq: Three times a day (TID) | ORAL | 0 refills | Status: DC | PRN

## 2017-02-04 MED ORDER — KETOROLAC TROMETHAMINE 15 MG/ML IJ SOLN: 15.0000 mg | Freq: Four times a day (QID) | INTRAMUSCULAR | Status: DC | PRN

## 2017-02-04 MED ORDER — ASPIRIN EC 325 MG PO TBEC: 325.0000 mg | DELAYED_RELEASE_TABLET | Freq: Two times a day (BID) | ORAL | 0 refills | Status: DC

## 2017-02-04 MED ORDER — HYDROMORPHONE HCL-NACL 0.5-0.9 MG/ML-% IV SOSY
1.0000 mg | PREFILLED_SYRINGE | INTRAVENOUS | Status: DC | PRN
  Filled 2017-02-04: qty 2

## 2017-02-04 MED ORDER — OXYCODONE HCL 5 MG PO TABS: 5.0000 mg | ORAL_TABLET | ORAL | 0 refills | Status: DC | PRN

## 2017-02-04 NOTE — Progress Notes (Signed)
PT Cancellation Note  Patient Details Name: [name] MRN: [id number] DOB: 1955-12-22   Cancelled Treatment:    Reason Eval/Treat Not Completed: Pain limiting ability to participate. Patient reports recently had IV meds. Is ambulating to BR w/ staff. Will follow up tomorrow.   [name] 02/04/2017, 3:30 PM [name] PT [phone]

## 2017-02-04 NOTE — Progress Notes (Signed)
02/04/17 2150 Nursing Mechele Claude PA called reg patient c/o of pain despite prescribed meds. Order received for tordol and too increase iv dilaudid. Will continue to monitor patient

## 2017-02-04 NOTE — Discharge Instructions (Signed)
Weightbearing as tolerated with walker Wear knee immobilizer at all times Change your dressing daily. Shower only, no tub bath. Call if any temperatures greater than 101 or any wound complications: [phone] during the day and ask for Dr. Charlestine Night nurse, Brunilda Payor.    Why was Xarelto prescribed for you? Xarelto was prescribed for you to reduce the risk of blood clots forming after orthopedic surgery. The medical term for these abnormal blood clots is venous thromboembolism (VTE).  What do you need to know about xarelto ? Take your Xarelto ONCE DAILY at the same time every day. You may take it either with or without food.  If you have difficulty swallowing the tablet whole, you may crush it and mix in applesauce just prior to taking your dose.  Take Xarelto exactly as prescribed by your doctor and DO NOT stop taking Xarelto without talking to the doctor who prescribed the medication.  Stopping without other VTE prevention medication to take the place of Xarelto may increase your risk of developing a clot.  After discharge, you should have regular check-up appointments with your healthcare provider that is prescribing your Xarelto.    What do you do if you miss a dose? If you miss a dose, take it as soon as you remember on the same day then continue your regularly scheduled once daily regimen the next day. Do not take two doses of Xarelto on the same day.   Important Safety Information A possible side effect of Xarelto is bleeding. You should call your healthcare provider right away if you experience any of the following: ? Bleeding from an injury or your nose that does not stop. ? Unusual colored urine (red or dark brown) or unusual colored stools (red or black). ? Unusual bruising for unknown reasons. ? A serious fall or if you hit your head (even if there is no bleeding).  Some medicines may interact with Xarelto and might increase your risk of bleeding while on  Xarelto. To help avoid this, consult your healthcare provider or pharmacist prior to using any new prescription or non-prescription medications, including herbals, vitamins, non-steroidal anti-inflammatory drugs (NSAIDs) and supplements.  This website has more information on Xarelto: [email or web address]

## 2017-02-04 NOTE — Evaluation (Signed)
Occupational Therapy Evaluation and Discharge Patient Details Name: [name] MRN: [id number] DOB: 19-Aug-1955 Today's Date: 02/04/2017    History of Present Illness Pt is a 61 y.o. female s/p Repair right quadriceps. PMHx: CKD, GERD, Spondylolisthesis of lumbosacral region, R TKA 12/22/2016.   Clinical Impression   Pt reports she was independent with ADL PTA. Currently pt overall supervision for ADL and functional mobility. All education completed with pt; she is able to maintain TDWB on RLE throughout functional activities. Pt planning to d/c home with intermittent supervision from family/friends. No further acute OT needs identified; signing off at this time. Please re-consult if needs change. Thank you for this referral.    Follow Up Recommendations  No OT follow up;Supervision - Intermittent    Equipment Recommendations  None recommended by OT    Recommendations for Other Services PT consult     Precautions / Restrictions Precautions Precautions: Knee;Fall Precaution Comments: Discussed no pillow under knee. Per pt she is not allowed any knee flexion Required Braces or Orthoses: Knee Immobilizer - Right Knee Immobilizer - Right: On at all times Restrictions Weight Bearing Restrictions: Yes RLE Weight Bearing: Touchdown weight bearing      Mobility Bed Mobility Overal bed mobility: Modified Independent             General bed mobility comments: HOB elevated but no assist needed  Transfers Overall transfer level: Needs assistance Equipment used: Rolling walker (2 wheeled) Transfers: Sit to/from Stand Sit to Stand: Supervision         General transfer comment: for safety, good hand placement and technique    Balance Overall balance assessment: No apparent balance deficits (not formally assessed)                                         ADL either performed or assessed with clinical judgement   ADL Overall ADL's : Needs  assistance/impaired Eating/Feeding: Independent;Sitting   Grooming: Set up;Supervision/safety;Standing;Wash/dry face;Oral care;Applying deodorant   Upper Body Bathing: Set up;Sitting   Lower Body Bathing: Supervison/ safety;Sit to/from stand   Upper Body Dressing : Set up;Sitting   Lower Body Dressing: Supervision/safety;Sit to/from stand   Toilet Transfer: Supervision/safety;Ambulation;RW           Functional mobility during ADLs: Supervision/safety;Rolling walker General ADL Comments: Educated on RLE TDWB; pt able to maintain throughout. Discussed home safety, staying on first floor, sponge bathing.     Vision         Perception     Praxis      Pertinent Vitals/Pain Pain Assessment: Faces Faces Pain Scale: Hurts a little bit Pain Location: R knee Pain Descriptors / Indicators: Sore Pain Intervention(s): Monitored during session;Repositioned;Ice applied     Hand Dominance     Extremity/Trunk Assessment Upper Extremity Assessment Upper Extremity Assessment: Overall WFL for tasks assessed   Lower Extremity Assessment Lower Extremity Assessment: Defer to PT evaluation   Cervical / Trunk Assessment Cervical / Trunk Assessment: Normal   Communication Communication Communication: No difficulties   Cognition Arousal/Alertness: Awake/alert Behavior During Therapy: WFL for tasks assessed/performed Overall Cognitive Status: Within Functional Limits for tasks assessed                                     General Comments       Exercises  Shoulder Instructions      Home Living Family/patient expects to be discharged to:: Private residence Living Arrangements: Alone Available Help at Discharge: Family;Available PRN/intermittently;Friend(s) Type of Home: House Home Access: Stairs to enter [employer] of Steps: 1   Home Layout: Two level     Bathroom Shower/Tub: Occupational psychologist: Standard     Home  Equipment: Shower seat - built in   Additional Comments: Pt planning to stay on main level of house for first 2 weeks. Will sleep on couch, sponge bathe in half bath on first floor      Prior Functioning/Environment Level of Independence: Independent        Comments: works as a Engineer, water        OT Problem List:        OT Treatment/Interventions:      OT Goals(Current goals can be found in the care plan section) Acute Rehab OT Goals Patient Stated Goal: get better and back to work OT Goal Formulation: All assessment and education complete, DC therapy  OT Frequency:     Barriers to D/C:            Co-evaluation              AM-PAC PT "6 Clicks" Daily Activity     Outcome Measure Help from another person eating meals?: None Help from another person taking care of personal grooming?: None Help from another person toileting, which includes using toliet, bedpan, or urinal?: A Little Help from another person bathing (including washing, rinsing, drying)?: A Little Help from another person to put on and taking off regular upper body clothing?: None Help from another person to put on and taking off regular lower body clothing?: A Little 6 Click Score: 21   End of Session Equipment Utilized During Treatment: Rolling walker;Right knee immobilizer  Activity Tolerance: Patient tolerated treatment well Patient left: in chair;with call bell/phone within reach  OT Visit Diagnosis: Other abnormalities of gait and mobility (R26.89)                Time: 5366-4403 OT Time Calculation (min): 31 min Charges:  OT General Charges $OT Visit: 1 Procedure OT Evaluation $OT Eval Moderate Complexity: 1 Procedure OT Treatments $Self Care/Home Management : 8-22 mins G-Codes:     Mel Almond A. Ulice Brilliant, M.S., OTR/L Pager: [place] 02/04/2017, 9:38 AM

## 2017-02-04 NOTE — Progress Notes (Signed)
PT Cancellation Note  Patient Details Name: [name] MRN: [id number] DOB: [birth year]   Cancelled Treatment:    Reason Eval/Treat Not Completed: Pain limiting ability to participate. Just returned  to bed, pain limiting at present. Check back later. Was up in room already.   [name] 02/04/2017, 11:27 AM

## 2017-02-04 NOTE — Progress Notes (Signed)
Subjective: 1 Day Post-Op Procedure(s) (LRB): REPAIR QUADRICEPS WITH GRAFT (Right) Patient reports pain as 2 on 0-10 scale.    Objective: Vital signs in last 24 hours: Temp:  [97.1 F (36.2 C)-98.5 F (36.9 C)] 97.8 F (36.6 C) (07/20 0453) Pulse Rate:  [83-103] 84 (07/20 0453) Resp:  [14-21] 15 (07/20 0453) BP: (102-152)/(54-96) 102/63 (07/20 0453) SpO2:  [93 %-100 %] 94 % (07/20 0453) Weight:  [80.7 kg (178 lb)] 80.7 kg (178 lb) (07/19 1728)  Intake/Output from previous day: 07/19 0701 - 07/20 0700 In: 2268.3 [P.O.:600; I.V.:1668.3] Out: 2475 [Urine:2425; Blood:50] Intake/Output this shift: No intake/output data recorded.   Recent Labs  02/01/17 1106 02/04/17 0556  HGB 11.7* 11.1*    Recent Labs  02/01/17 1106 02/04/17 0556  WBC 5.5 9.2  RBC 3.95 3.60*  HCT 35.9* 33.3*  PLT 284 288    Recent Labs  02/01/17 1106 02/04/17 0556  NA 140 142  K 3.9 3.9  CL 106 107  CO2 26 26  BUN 11 11  CREATININE 0.70 0.76  GLUCOSE 98 138*  CALCIUM 9.0 9.1    Recent Labs  02/01/17 1106  INR 0.98    Neurologically intact  Assessment/Plan: 1 Day Post-Op Procedure(s) (LRB): REPAIR QUADRICEPS WITH GRAFT (Right) Up with therapy discontinued plans fo Saturday.  Cottrell Gentles A 02/04/2017, 7:19 AM

## 2017-02-04 NOTE — Op Note (Signed)
NAME:  [name], [name].:  MEDICAL RECORD NO.:  [id number]  LOCATION:                                 FACILITY:  PHYSICIAN:  Kipp Brood. Fitz Matsuo, M.D.DATE OF BIRTH:  06-25-1956  DATE OF PROCEDURE:  02/03/2017 DATE OF DISCHARGE:                              OPERATIVE REPORT   SURGEON:  Kipp Brood. Gladstone Lighter, M.D.  ASSISTANT:  Rod Can, MD and [place], [place].  PREOPERATIVE DIAGNOSIS:  A quadriceps tendon tear postoperatively in a right total knee versus a complete disruption of her medial parapatellar incision, right knee.  POSTOPERATIVE DIAGNOSIS:  It appears that she had a complete rupture of the superior portion of her median parapatellar incision secondary to a fall walking backwards down steps.  OPERATION:  Repair of the quadriceps tendon site where she had a complete tear.  DESCRIPTION OF PROCEDURE:  Under general anesthesia, a routine orthopedic prep and draping of the right lower extremity was carried out.  The tourniquet was used, was elevated to 325 mmHg.  Appropriate time-out was carried out, also marked the appropriate right knee in the holding area.  At this time, the patient had 1 g of vancomycin. Incision was made through the old incision site.  At this particular time, I went right down onto the quadriceps tendon.  I dissected off the soft tissue and it appeared at this time, that when she fell,, she disrupted the superior portion of her medial parapatellar incision site all the way across the superior pole of the patella.  This was a very unusual type of tear.  So, we thoroughly irrigated out the joint, removed some loose sutures in that area and there were no other abnormalities on inspection of the patellar button in the joint. Following that, 3 drill holes were made up through the patella, superficial to the patellar button and the suture passer was passed through these drill holes.  A loop was created and the sutures  were brought down through after we sutured the quadriceps tendon back to the patella.  They were brought down and the tendon was brought flush with the superior pole of the patella.  The sutures were tied distally.  We then used some individual repair sutures in between 0 sutures along the tear site.  Following that, we reinforced the repair with a dermis band graft.  Note, the wound was cautiously irrigated during the procedure with double antibiotic solution.  Remaining part of the wound was closed with subcuticular sutures in usual fashion.  Sterile dressings were applied.  She was placed in a nice knee immobilizer held in extension. She will be placed on Xarelto postop, followed by aspirin.  She will be kept in the hospital few days for rehabilitation purposes.         ______________________________ Kipp Brood Gladstone Lighter, M.D.    RAG/MEDQ  D:  02/03/2017  T:  02/03/2017  Job:  [id number]

## 2017-02-05 DIAGNOSIS — S76111A Strain of right quadriceps muscle, fascia and tendon, initial encounter: Secondary | ICD-10-CM | POA: Diagnosis not present

## 2017-02-05 LAB — CBC
HEMATOCRIT: 33.5 % — AB (ref 36.0–46.0)
HEMOGLOBIN: 10.9 g/dL — AB (ref 12.0–15.0)
MCH: 30.9 pg (ref 26.0–34.0)
MCHC: 32.5 g/dL (ref 30.0–36.0)
MCV: 94.9 fL (ref 78.0–100.0)
Platelets: 265 10*3/uL (ref 150–400)
RBC: 3.53 MIL/uL — ABNORMAL LOW (ref 3.87–5.11)
RDW: 13.6 % (ref 11.5–15.5)
WBC: 7.4 10*3/uL (ref 4.0–10.5)

## 2017-02-05 LAB — BASIC METABOLIC PANEL
ANION GAP: 8 (ref 5–15)
BUN: 13 mg/dL (ref 6–20)
CHLORIDE: 106 mmol/L (ref 101–111)
CO2: 29 mmol/L (ref 22–32)
Calcium: 8.6 mg/dL — ABNORMAL LOW (ref 8.9–10.3)
Creatinine, Ser: 0.75 mg/dL (ref 0.44–1.00)
GFR calc non Af Amer: >60 mL/min (ref >60)
Glucose, Bld: 105 mg/dL — ABNORMAL HIGH (ref 65–99)
POTASSIUM: 3.9 mmol/L (ref 3.5–5.1)
Sodium: 143 mmol/L (ref 135–145)

## 2017-02-05 NOTE — Progress Notes (Signed)
Patient ready for discharge. Jennette Kettle paged for discharge order  D Mateo Flow RN

## 2017-02-05 NOTE — Progress Notes (Addendum)
Subjective: 2 Days Post-Op Procedure(s) (LRB): REPAIR QUADRICEPS WITH GRAFT (Right) Patient reports pain as 5 on 0-10 scale.   Pain when out of bed.Wants to stay another day  Objective: Vital signs in last 24 hours: Temp:  [98.4 F (36.9 C)-98.8 F (37.1 C)] 98.5 F (36.9 C) (07/21 0530) Pulse Rate:  [73-79] 75 (07/21 0530) Resp:  [15-16] 16 (07/21 0530) BP: (121-125)/(71-80) 123/75 (07/21 0530) SpO2:  [95 %-97 %] 95 % (07/21 0530)  Intake/Output from previous day: 07/20 0701 - 07/21 0700 In: 1440 [P.O.:1440] Out: 0  Intake/Output this shift: No intake/output data recorded.   Recent Labs  02/04/17 0556 02/05/17 0447  HGB 11.1* 10.9*    Recent Labs  02/04/17 0556 02/05/17 0447  WBC 9.2 7.4  RBC 3.60* 3.53*  HCT 33.3* 33.5*  PLT 288 265    Recent Labs  02/04/17 0556 02/05/17 0447  NA 142 143  K 3.9 3.9  CL 107 106  CO2 26 29  BUN 11 13  CREATININE 0.76 0.75  GLUCOSE 138* 105*  CALCIUM 9.1 8.6*   No results for input(s): LABPT, INR in the last 72 hours.  Neurologically intact Neurovascular intact Dorsiflexion/Plantar flexion intact Incision: dressing C/D/I No DVT  Assessment/Plan: 2 Days Post-Op Procedure(s) (LRB): REPAIR QUADRICEPS WITH GRAFT (Right) Advance diet Up with therapy D/C IV fluids Plan for discharge tomorrow  PT rec Bledsoe brace  [name] C 02/05/2017, 7:52 AM

## 2017-02-05 NOTE — Evaluation (Addendum)
Physical Therapy Evaluation Patient Details Name: [name] MRN: [id number] DOB: Oct 31, 1955 Today's Date: 02/05/2017   History of Present Illness  Pt is a 61 y.o. female s/p Repair right quadriceps. PMHx: CKD, GERD, Spondylolisthesis of lumbosacral region, R TKA 12/22/2016.  Clinical Impression  The patient expresses much concern  About limiting activating right quads during mobility of which she reports was told  By MD. Leg lifter provided to use to self assist the right leg. The patient's right knee is not completely straight in [place], She reports opening the brace and knee flexes a bit. A more secure brace could benefit and ensure no flexion of the right knee.  Dr. Tonita Cong in and has ordered a Bledsoe brace. Pt admitted with above diagnosis. Pt currently with functional limitations due to the deficits listed below (see PT Problem List).  Pt will benefit from skilled PT to increase their independence and safety with mobility to allow discharge to the venue listed below.   Extra time spent reassuring patient and reviewing precautions in regards to  Right  Knee surgery recovery.       Follow Up Recommendations DC plan and follow up therapy as arranged by surgeon    Equipment Recommendations  None recommended by PT    Recommendations for Other Services       Precautions / Restrictions Precautions Precautions: Knee;Fall Precaution Comments: Discussed no pillow under knee. Per pt she is not allowed any knee flexion Required Braces or Orthoses: Knee Immobilizer - Right;Other Brace/Splint Knee Immobilizer - Right: On at all times Other Brace/Splint: BLEDSOE ordered Restrictions RLE Weight Bearing: Touchdown weight bearing      Mobility  Bed Mobility Overal bed mobility: Needs Assistance Bed Mobility: Supine to Sit;Sit to Supine     Supine to sit: Modified independent (Device/Increase time) Sit to supine: Min assist   General bed mobility comments: assist with right leg onto bed.    Transfers Overall transfer level: Needs assistance Equipment used: Rolling walker (2 wheeled) Transfers: Sit to/from Stand Sit to Stand: Min assist         General transfer comment: assist the right leg onto to support while on [facility]  Ambulation/Gait Ambulation/Gait assistance: Min guard Ambulation Distance (Feet): 80 Feet Assistive device: Rolling walker (2 wheeled) Gait Pattern/deviations: Step-to pattern     General Gait Details: patient maintained TDWB  Stairs            Wheelchair Mobility    Modified Rankin (Stroke Patients Only)       Balance                                             Pertinent Vitals/Pain Faces Pain Scale: Hurts even more Pain Location: R knee Pain Descriptors / Indicators: Sore;Aching Pain Intervention(s): Limited activity within patient's tolerance;Monitored during session;Premedicated before session;Repositioned;Ice applied    Home Living Family/patient expects to be discharged to:: Private residence Living Arrangements: Alone Available Help at Discharge: Family;Available PRN/intermittently;Friend(s) Type of Home: House Home Access: Stairs to enter   Entrance Stairs-Number of Steps: 1 Home Layout: Two level Home Equipment: Shower seat - built in Additional Comments: Pt planning to stay on main level of house for first 2 weeks. Will sleep on couch, sponge bathe in half bath on first floor    Prior Function Level of Independence: Independent with assistive device(s)  Comments: works as a Environmental manager        Extremity/Trunk Assessment   Upper Extremity Assessment Upper Extremity Assessment: Defer to OT evaluation    Lower Extremity Assessment Lower Extremity Assessment: RLE deficits/detail RLE Deficits / Details: patient in bed with KI open. Noted knee is flexed ~15 degrees at rest.     Cervical / Trunk Assessment Cervical / Trunk Assessment: Normal  Communication    Communication: No difficulties  Cognition Arousal/Alertness: Awake/alert Behavior During Therapy: WFL for tasks assessed/performed Overall Cognitive Status: Within Functional Limits for tasks assessed                                        General Comments      Exercises     Assessment/Plan    PT Assessment Patient needs continued PT services  PT Problem List Decreased strength;Decreased range of motion;Decreased activity tolerance;Decreased mobility;Decreased knowledge of precautions;Decreased safety awareness;Decreased knowledge of use of DME;Pain       PT Treatment Interventions DME instruction;Gait training;Stair training;Functional mobility training;Therapeutic activities;Patient/family education    PT Goals (Current goals can be found in the Care Plan section)  Acute Rehab PT Goals Patient Stated Goal: get better and back to work PT Goal Formulation: With patient Time For Goal Achievement: 02/12/17 Potential to Achieve Goals: Good    Frequency 7X/week   Barriers to discharge        Co-evaluation               AM-PAC PT "6 Clicks" Daily Activity  Outcome Measure Difficulty turning over in bed (including adjusting bedclothes, sheets and blankets)?: Total Difficulty moving from lying on back to sitting on the side of the bed? : Total Difficulty sitting down on and standing up from a chair with arms (e.g., wheelchair, bedside commode, etc,.)?: Total Help needed moving to and from a bed to chair (including a wheelchair)?: Total Help needed walking in hospital room?: Total Help needed climbing 3-5 steps with a railing? : Total 6 Click Score: 6    End of Session Equipment Utilized During Treatment: Right knee immobilizer Activity Tolerance: Patient tolerated treatment well Patient left: in bed;with call bell/phone within reach Nurse Communication: Mobility status PT Visit Diagnosis: Difficulty in walking, not elsewhere classified  (R26.2);Pain Pain - Right/Left: Right Pain - part of body: Knee    Time: 9562-1308 PT Time Calculation (min) (ACUTE ONLY): 68 min   Charges:   PT Evaluation $PT Eval Moderate Complexity: 1 Procedure PT Treatments $Gait Training: 23-37 mins $Therapeutic Activity: 8-22 mins $Self Care/Home Management: 8-22   PT G Codes:   PT G-Codes **NOT FOR INPATIENT CLASS** Functional Assessment Tool Used: Clinical judgement Functional Limitation: Mobility: Walking and moving around Mobility: Walking and Moving Around Current Status ([id number]): At least 20 percent but less than 40 percent impaired, limited or restricted Mobility: Walking and Moving Around Goal Status [phone]): At least 1 percent but less than 20 percent impaired, limited or restricted    [facility] PT [phone]   [name] 02/05/2017, 12:07 PM

## 2017-02-06 NOTE — Discharge Summary (Signed)
Physician Discharge Summary   Patient ID: [name] MRN: [id number] DOB/AGE: 09/05/1955 61 y.o.  Admit date: 02/03/2017 Discharge date: 02/05/2017  Primary Diagnosis: Rupture of quadriceps tendon and rupture of total knee arthrotomy, right knee   Admission Diagnoses:  Past Medical History:  Diagnosis Date  . Arthritis   . Arthritis pain   . Asthma   . Biliary dyskinesia   . Chronic kidney disease    uremic calcium deficiency  . Complication of anesthesia    Difficulty arousing  . Constipation    from ibs  . Diarrhea    occ from ibs  . GERD (gastroesophageal reflux disease)   . H/O hiatal hernia   . History of kidney stones   . Liver tumor (benign) 05/2016  . PONV (postoperative nausea and vomiting)   . Rupture of right quadriceps tendon 01/26/2017  . Spondylolisthesis of lumbosacral region    Discharge Diagnoses:   Active Problems:   Rupture of quadriceps tendon, right, initial encounter  Estimated body mass index is 29.62 kg/m as calculated from the following:   Height as of this encounter: 5' 5"  (1.651 m).   Weight as of this encounter: 80.7 kg (178 lb).  Procedure:  Procedure(s) (LRB): REPAIR QUADRICEPS WITH GRAFT (Right)   Consults: None  HPI: The patient is a 61 year old female with the chief complaint of right knee pain. She had a right total knee arthroplasty done about 6 weeks ago. She had been progressing well. Unfortunately, she fell down some steps on 01/27/17. She developed increased right knee pain and decreased function. Ultrasound showed a torn quadriceps tendon.  Laboratory Data: Admission on 02/03/2017, Discharged on 02/05/2017  Component Date Value Ref Range Status  . WBC 02/04/2017 9.2  4.0 - 10.5 K/uL Final  . RBC 02/04/2017 3.60* 3.87 - 5.11 MIL/uL Final  . Hemoglobin 02/04/2017 11.1* 12.0 - 15.0 g/dL Final  . HCT 02/04/2017 33.3* 36.0 - 46.0 % Final  . MCV 02/04/2017 92.5  78.0 - 100.0 fL Final  . MCH 02/04/2017 30.8  26.0 - 34.0 pg  Final  . MCHC 02/04/2017 33.3  30.0 - 36.0 g/dL Final  . RDW 02/04/2017 12.9  11.5 - 15.5 % Final  . Platelets 02/04/2017 288  150 - 400 K/uL Final  . Sodium 02/04/2017 142  135 - 145 mmol/L Final  . Potassium 02/04/2017 3.9  3.5 - 5.1 mmol/L Final  . Chloride 02/04/2017 107  101 - 111 mmol/L Final  . CO2 02/04/2017 26  22 - 32 mmol/L Final  . Glucose, Bld 02/04/2017 138* 65 - 99 mg/dL Final  . BUN 02/04/2017 11  6 - 20 mg/dL Final  . Creatinine, Ser 02/04/2017 0.76  0.44 - 1.00 mg/dL Final  . Calcium 02/04/2017 9.1  8.9 - 10.3 mg/dL Final  . GFR calc non Af Amer 02/04/2017 >60  >60 mL/min Final  . GFR calc Af Amer 02/04/2017 >60  >60 mL/min Final   Comment: (NOTE) The eGFR has been calculated using the CKD EPI equation. This calculation has not been validated in all clinical situations. eGFR's persistently <60 mL/min signify possible Chronic Kidney Disease.   . Anion gap 02/04/2017 9  5 - 15 Final  . WBC 02/05/2017 7.4  4.0 - 10.5 K/uL Final  . RBC 02/05/2017 3.53* 3.87 - 5.11 MIL/uL Final  . Hemoglobin 02/05/2017 10.9* 12.0 - 15.0 g/dL Final  . HCT 02/05/2017 33.5* 36.0 - 46.0 % Final  . MCV 02/05/2017 94.9  78.0 - 100.0  fL Final  . MCH 02/05/2017 30.9  26.0 - 34.0 pg Final  . MCHC 02/05/2017 32.5  30.0 - 36.0 g/dL Final  . RDW 02/05/2017 13.6  11.5 - 15.5 % Final  . Platelets 02/05/2017 265  150 - 400 K/uL Final  . Sodium 02/05/2017 143  135 - 145 mmol/L Final  . Potassium 02/05/2017 3.9  3.5 - 5.1 mmol/L Final  . Chloride 02/05/2017 106  101 - 111 mmol/L Final  . CO2 02/05/2017 29  22 - 32 mmol/L Final  . Glucose, Bld 02/05/2017 105* 65 - 99 mg/dL Final  . BUN 02/05/2017 13  6 - 20 mg/dL Final  . Creatinine, Ser 02/05/2017 0.75  0.44 - 1.00 mg/dL Final  . Calcium 02/05/2017 8.6* 8.9 - 10.3 mg/dL Final  . GFR calc non Af Amer 02/05/2017 >60  >60 mL/min Final  . GFR calc Af Amer 02/05/2017 >60  >60 mL/min Final   Comment: (NOTE) The eGFR has been calculated using the CKD  EPI equation. This calculation has not been validated in all clinical situations. eGFR's persistently <60 mL/min signify possible Chronic Kidney Disease.   Georgiann Hahn gap 02/05/2017 8  5 - 15 Final  Hospital Outpatient Visit on 02/01/2017  Component Date Value Ref Range Status  . aPTT 02/01/2017 25  24 - 36 seconds Final  . WBC 02/01/2017 5.5  4.0 - 10.5 K/uL Final  . RBC 02/01/2017 3.95  3.87 - 5.11 MIL/uL Final  . Hemoglobin 02/01/2017 11.7* 12.0 - 15.0 g/dL Final  . HCT 02/01/2017 35.9* 36.0 - 46.0 % Final  . MCV 02/01/2017 90.9  78.0 - 100.0 fL Final  . MCH 02/01/2017 29.6  26.0 - 34.0 pg Final  . MCHC 02/01/2017 32.6  30.0 - 36.0 g/dL Final  . RDW 02/01/2017 13.0  11.5 - 15.5 % Final  . Platelets 02/01/2017 284  150 - 400 K/uL Final  . Neutrophils Relative % 02/01/2017 60  % Final  . Neutro Abs 02/01/2017 3.3  1.7 - 7.7 K/uL Final  . Lymphocytes Relative 02/01/2017 30  % Final  . Lymphs Abs 02/01/2017 1.6  0.7 - 4.0 K/uL Final  . Monocytes Relative 02/01/2017 7  % Final  . Monocytes Absolute 02/01/2017 0.4  0.1 - 1.0 K/uL Final  . Eosinophils Relative 02/01/2017 3  % Final  . Eosinophils Absolute 02/01/2017 0.2  0.0 - 0.7 K/uL Final  . Basophils Relative 02/01/2017 0  % Final  . Basophils Absolute 02/01/2017 0.0  0.0 - 0.1 K/uL Final  . Sodium 02/01/2017 140  135 - 145 mmol/L Final  . Potassium 02/01/2017 3.9  3.5 - 5.1 mmol/L Final  . Chloride 02/01/2017 106  101 - 111 mmol/L Final  . CO2 02/01/2017 26  22 - 32 mmol/L Final  . Glucose, Bld 02/01/2017 98  65 - 99 mg/dL Final  . BUN 02/01/2017 11  6 - 20 mg/dL Final  . Creatinine, Ser 02/01/2017 0.70  0.44 - 1.00 mg/dL Final  . Calcium 02/01/2017 9.0  8.9 - 10.3 mg/dL Final  . Total Protein 02/01/2017 7.4  6.5 - 8.1 g/dL Final  . Albumin 02/01/2017 4.3  3.5 - 5.0 g/dL Final  . AST 02/01/2017 25  15 - 41 U/L Final  . ALT 02/01/2017 21  14 - 54 U/L Final  . Alkaline Phosphatase 02/01/2017 73  38 - 126 U/L Final  . Total  Bilirubin 02/01/2017 0.4  0.3 - 1.2 mg/dL Final  . GFR calc non Af Amer 02/01/2017 >  60  >60 mL/min Final  . GFR calc Af Amer 02/01/2017 >60  >60 mL/min Final   Comment: (NOTE) The eGFR has been calculated using the CKD EPI equation. This calculation has not been validated in all clinical situations. eGFR's persistently <60 mL/min signify possible Chronic Kidney Disease.   . Anion gap 02/01/2017 8  5 - 15 Final  . Prothrombin Time 02/01/2017 13.0  11.4 - 15.2 seconds Final  . INR 02/01/2017 0.98   Final  . ABO/RH(D) 02/01/2017 A POS   Final  . Antibody Screen 02/01/2017 NEG   Final  . Sample Expiration 02/01/2017 02/06/2017   Final  . Extend sample reason 02/01/2017 NO TRANSFUSIONS OR PREGNANCY IN THE PAST 3 MONTHS   Final  . Color, Urine 02/01/2017 YELLOW  YELLOW Final  . APPearance 02/01/2017 CLEAR  CLEAR Final  . Specific Gravity, Urine 02/01/2017 1.021  1.005 - 1.030 Final  . pH 02/01/2017 5.0  5.0 - 8.0 Final  . Glucose, UA 02/01/2017 NEGATIVE  NEGATIVE mg/dL Final  . Hgb urine dipstick 02/01/2017 NEGATIVE  NEGATIVE Final  . Bilirubin Urine 02/01/2017 NEGATIVE  NEGATIVE Final  . Ketones, ur 02/01/2017 NEGATIVE  NEGATIVE mg/dL Final  . Protein, ur 02/01/2017 NEGATIVE  NEGATIVE mg/dL Final  . Nitrite 02/01/2017 NEGATIVE  NEGATIVE Final  . Leukocytes, UA 02/01/2017 NEGATIVE  NEGATIVE Final  Admission on 12/22/2016, Discharged on 12/24/2016  Component Date Value Ref Range Status  . WBC 12/23/2016 10.8* 4.0 - 10.5 K/uL Final  . RBC 12/23/2016 3.64* 3.87 - 5.11 MIL/uL Final  . Hemoglobin 12/23/2016 11.5* 12.0 - 15.0 g/dL Final  . HCT 12/23/2016 34.1* 36.0 - 46.0 % Final  . MCV 12/23/2016 93.7  78.0 - 100.0 fL Final  . MCH 12/23/2016 31.6  26.0 - 34.0 pg Final  . MCHC 12/23/2016 33.7  30.0 - 36.0 g/dL Final  . RDW 12/23/2016 12.8  11.5 - 15.5 % Final  . Platelets 12/23/2016 255  150 - 400 K/uL Final  . Sodium 12/23/2016 140  135 - 145 mmol/L Final  . Potassium 12/23/2016 4.2  3.5  - 5.1 mmol/L Final  . Chloride 12/23/2016 107  101 - 111 mmol/L Final  . CO2 12/23/2016 28  22 - 32 mmol/L Final  . Glucose, Bld 12/23/2016 126* 65 - 99 mg/dL Final  . BUN 12/23/2016 9  6 - 20 mg/dL Final  . Creatinine, Ser 12/23/2016 0.67  0.44 - 1.00 mg/dL Final  . Calcium 12/23/2016 8.7* 8.9 - 10.3 mg/dL Final  . GFR calc non Af Amer 12/23/2016 >60  >60 mL/min Final  . GFR calc Af Amer 12/23/2016 >60  >60 mL/min Final   Comment: (NOTE) The eGFR has been calculated using the CKD EPI equation. This calculation has not been validated in all clinical situations. eGFR's persistently <60 mL/min signify possible Chronic Kidney Disease.   . Anion gap 12/23/2016 5  5 - 15 Final  . WBC 12/24/2016 9.8  4.0 - 10.5 K/uL Final  . RBC 12/24/2016 3.51* 3.87 - 5.11 MIL/uL Final  . Hemoglobin 12/24/2016 11.0* 12.0 - 15.0 g/dL Final  . HCT 12/24/2016 33.8* 36.0 - 46.0 % Final  . MCV 12/24/2016 96.3  78.0 - 100.0 fL Final  . MCH 12/24/2016 31.3  26.0 - 34.0 pg Final  . MCHC 12/24/2016 32.5  30.0 - 36.0 g/dL Final  . RDW 12/24/2016 13.4  11.5 - 15.5 % Final  . Platelets 12/24/2016 252  150 - 400 K/uL Final  . Sodium 12/24/2016  141  135 - 145 mmol/L Final  . Potassium 12/24/2016 3.7  3.5 - 5.1 mmol/L Final  . Chloride 12/24/2016 106  101 - 111 mmol/L Final  . CO2 12/24/2016 28  22 - 32 mmol/L Final  . Glucose, Bld 12/24/2016 135* 65 - 99 mg/dL Final  . BUN 12/24/2016 13  6 - 20 mg/dL Final  . Creatinine, Ser 12/24/2016 0.71  0.44 - 1.00 mg/dL Final  . Calcium 12/24/2016 8.7* 8.9 - 10.3 mg/dL Final  . GFR calc non Af Amer 12/24/2016 >60  >60 mL/min Final  . GFR calc Af Amer 12/24/2016 >60  >60 mL/min Final   Comment: (NOTE) The eGFR has been calculated using the CKD EPI equation. This calculation has not been validated in all clinical situations. eGFR's persistently <60 mL/min signify possible Chronic Kidney Disease.   Georgiann Hahn gap 12/24/2016 7  5 - 15 Final  Hospital Outpatient Visit on  12/16/2016  Component Date Value Ref Range Status  . aPTT 12/16/2016 23* 24 - 36 seconds Final  . WBC 12/16/2016 5.2  4.0 - 10.5 K/uL Final  . RBC 12/16/2016 4.23  3.87 - 5.11 MIL/uL Final  . Hemoglobin 12/16/2016 13.4  12.0 - 15.0 g/dL Final  . HCT 12/16/2016 40.1  36.0 - 46.0 % Final  . MCV 12/16/2016 94.8  78.0 - 100.0 fL Final  . MCH 12/16/2016 31.7  26.0 - 34.0 pg Final  . MCHC 12/16/2016 33.4  30.0 - 36.0 g/dL Final  . RDW 12/16/2016 12.8  11.5 - 15.5 % Final  . Platelets 12/16/2016 260  150 - 400 K/uL Final  . Neutrophils Relative % 12/16/2016 50  % Final  . Neutro Abs 12/16/2016 2.6  1.7 - 7.7 K/uL Final  . Lymphocytes Relative 12/16/2016 39  % Final  . Lymphs Abs 12/16/2016 2.0  0.7 - 4.0 K/uL Final  . Monocytes Relative 12/16/2016 9  % Final  . Monocytes Absolute 12/16/2016 0.4  0.1 - 1.0 K/uL Final  . Eosinophils Relative 12/16/2016 2  % Final  . Eosinophils Absolute 12/16/2016 0.1  0.0 - 0.7 K/uL Final  . Basophils Relative 12/16/2016 0  % Final  . Basophils Absolute 12/16/2016 0.0  0.0 - 0.1 K/uL Final  . Sodium 12/16/2016 144  135 - 145 mmol/L Final  . Potassium 12/16/2016 4.9  3.5 - 5.1 mmol/L Final  . Chloride 12/16/2016 109  101 - 111 mmol/L Final  . CO2 12/16/2016 29  22 - 32 mmol/L Final  . Glucose, Bld 12/16/2016 111* 65 - 99 mg/dL Final  . BUN 12/16/2016 14  6 - 20 mg/dL Final  . Creatinine, Ser 12/16/2016 0.77  0.44 - 1.00 mg/dL Final  . Calcium 12/16/2016 9.5  8.9 - 10.3 mg/dL Final  . Total Protein 12/16/2016 7.4  6.5 - 8.1 g/dL Final  . Albumin 12/16/2016 4.4  3.5 - 5.0 g/dL Final  . AST 12/16/2016 21  15 - 41 U/L Final  . ALT 12/16/2016 18  14 - 54 U/L Final  . Alkaline Phosphatase 12/16/2016 71  38 - 126 U/L Final  . Total Bilirubin 12/16/2016 0.7  0.3 - 1.2 mg/dL Final  . GFR calc non Af Amer 12/16/2016 >60  >60 mL/min Final  . GFR calc Af Amer 12/16/2016 >60  >60 mL/min Final   Comment: (NOTE) The eGFR has been calculated using the CKD EPI  equation. This calculation has not been validated in all clinical situations. eGFR's persistently <60 mL/min signify possible Chronic Kidney Disease.   Marland Kitchen  Anion gap 12/16/2016 6  5 - 15 Final  . Prothrombin Time 12/16/2016 12.6  11.4 - 15.2 seconds Final  . INR 12/16/2016 0.94   Final  . ABO/RH(D) 12/16/2016 A POS   Final  . Antibody Screen 12/16/2016 NEG   Final  . Sample Expiration 12/16/2016 12/25/2016   Final  . Extend sample reason 12/16/2016 NO TRANSFUSIONS OR PREGNANCY IN THE PAST 3 MONTHS   Final  . Color, Urine 12/16/2016 YELLOW  YELLOW Final  . APPearance 12/16/2016 CLEAR  CLEAR Final  . Specific Gravity, Urine 12/16/2016 1.018  1.005 - 1.030 Final  . pH 12/16/2016 5.0  5.0 - 8.0 Final  . Glucose, UA 12/16/2016 NEGATIVE  NEGATIVE mg/dL Final  . Hgb urine dipstick 12/16/2016 NEGATIVE  NEGATIVE Final  . Bilirubin Urine 12/16/2016 NEGATIVE  NEGATIVE Final  . Ketones, ur 12/16/2016 NEGATIVE  NEGATIVE mg/dL Final  . Protein, ur 12/16/2016 NEGATIVE  NEGATIVE mg/dL Final  . Nitrite 12/16/2016 NEGATIVE  NEGATIVE Final  . Leukocytes, UA 12/16/2016 NEGATIVE  NEGATIVE Final  . MRSA, PCR 12/16/2016 NEGATIVE  NEGATIVE Final  . Staphylococcus aureus 12/16/2016 NEGATIVE  NEGATIVE Final   Comment:        The Xpert SA Assay (FDA approved for NASAL specimens in patients over 25 years of age), is one component of a comprehensive surveillance program.  Test performance has been validated by [facility] for patients greater than or equal to 65 year old. It is not intended to diagnose infection nor to guide or monitor treatment.   . ABO/RH(D) 12/16/2016 A POS   Final      Hospital Course: [name] is a 61 y.o. who was admitted to [facility]. They were brought to the operating room on 02/03/2017 and underwent Procedure(s): [place].  Patient tolerated the procedure well and was later transferred to the recovery room and then to the orthopaedic floor for  postoperative care.  They were given PO and IV analgesics for pain control following their surgery.  They were given 24 hours of postoperative antibiotics of  Anti-infectives    Start     Dose/Rate Route Frequency Ordered Stop   02/03/17 2230  vancomycin (VANCOCIN) IVPB 1000 mg/200 mL premix     1,000 mg 200 mL/hr over 60 Minutes Intravenous Every 12 hours 02/03/17 1737 02/03/17 2300   02/03/17 1424  polymyxin B 500,000 Units, bacitracin 50,000 Units in sodium chloride 0.9 % 500 mL irrigation  Status:  Discontinued       As needed 02/03/17 1424 02/03/17 1546   02/03/17 1050  vancomycin (VANCOCIN) IVPB 1000 mg/200 mL premix     1,000 mg 200 mL/hr over 60 Minutes Intravenous On call to O.R. 02/03/17 1050 02/03/17 1405     and started on DVT prophylaxis in the form of Xarelto.   PT was ordered.  Discharge planning consulted to help with postop disposition and equipment needs.  Patient had a good night on the evening of surgery.  They started to get up OOB with therapy on day one. Continued to work with therapy into day two.  Dressing was changed on day two and the incision was clean and dry.  The patient had progressed with therapy and meeting their goals.  Incision was healing well.  Patient was seen in rounds and was ready to go home.   Diet: Cardiac diet Activity:WBAT; wear knee immobilizer at all times Follow-up:in 2 weeks Disposition - Home Discharged Condition: stable   Discharge  Instructions    Call MD / Call 911    Complete by:  As directed    If you experience chest pain or shortness of breath, CALL 911 and be transported to the hospital emergency room.  If you develope a fever above 101 F, pus (white drainage) or increased drainage or redness at the wound, or calf pain, call your surgeon's office.   Constipation Prevention    Complete by:  As directed    Drink plenty of fluids.  Prune juice may be helpful.  You may use a stool softener, such as Colace (over the counter) 100 mg  twice a day.  Use MiraLax (over the counter) for constipation as needed.   Diet - low sodium heart healthy    Complete by:  As directed    Discharge instructions    Complete by:  As directed    Weightbearing as tolerated with walker Wear knee immobilizer at all times Change your dressing daily. Shower only, no tub bath. Call if any temperatures greater than 101 or any wound complications: [phone] during the day and ask for Dr. Charlestine Night nurse, Brunilda Payor.   Increase activity slowly as tolerated    Complete by:  As directed      Allergies as of 02/05/2017      Reactions   Morphine And Related Other (See Comments)   Low b/p.Karma Greaser...hallucinations Pt was hospitalized due to reaction.   Penicillins Anaphylaxis   Has patient had a PCN reaction causing immediate rash, facial/tongue/throat swelling, SOB or lightheadedness with hypotension: Yes Has patient had a PCN reaction causing severe rash involving mucus membranes or skin necrosis: No Has patient had a PCN reaction that required hospitalization: No Has patient had a PCN reaction occurring within the last 10 years: No If all of the above answers are "NO", then may proceed with Cephalosporin use.   Chlorhexidine Hives, Itching   Latex Itching, Other (See Comments)   Blistering   Erythromycin Hives, Rash      Medication List    STOP taking these medications   HYDROcodone-acetaminophen 7.5-325 MG tablet Commonly known as:  NORCO   oxyCODONE-acetaminophen 10-325 MG tablet Commonly known as:  PERCOCET     TAKE these medications   aspirin EC 325 MG tablet Take 1 tablet (325 mg total) by mouth 2 (two) times daily. What changed:  when to take this   cyclobenzaprine 10 MG tablet Commonly known as:  FLEXERIL Take 1 tablet (10 mg total) by mouth 3 (three) times daily as needed for muscle spasms.   gabapentin 300 MG capsule Commonly known as:  NEURONTIN Take 300 mg by mouth daily.   metoCLOPramide 10 MG tablet Commonly  known as:  REGLAN Take 1 tablet (10 mg total) by mouth every 8 (eight) hours as needed for nausea.   oxyCODONE 5 MG immediate release tablet Commonly known as:  Oxy IR/ROXICODONE Take 1-2 tablets (5-10 mg total) by mouth every 3 (three) hours as needed for moderate pain or severe pain. What changed:  when to take this  reasons to take this   pantoprazole 40 MG tablet Commonly known as:  PROTONIX Take 40 mg by mouth daily.   VENTOLIN HFA 108 (90 Base) MCG/ACT inhaler Generic drug:  albuterol Inhale 1-2 puffs into the lungs every 6 (six) hours as needed for wheezing or shortness of breath.      Follow-up Information    Latanya Maudlin, MD. Schedule an appointment as soon as possible for a visit in 1  week(s).   Specialty:  Orthopedic Surgery Contact information: [address] [place] [address] [phone]           Signed: Ardeen Jourdain, PA-C Orthopaedic Surgery 02/07/2017, 8:44 AM

## 2017-02-09 NOTE — Progress Notes (Signed)
Late entry for missed gcode    02/04/17 0924  OT Time Calculation  OT Start Time (ACUTE ONLY) 0817  OT Stop Time (ACUTE ONLY) 0848  OT Time Calculation (min) 31 min  OT G-codes **NOT FOR INPATIENT CLASS**  Functional Assessment Tool Used Clinical judgement  Functional Limitation Self care  Self Care Current Status (L2957) CI  Self Care Goal Status (M7340) CI  Self Care Discharge Status (Z7096) CI  OT General Charges  $OT Visit 1 Procedure  OT Evaluation  $OT Eval Moderate Complexity 1 Procedure  OT Treatments  $Self Care/Home Management  8-22 mins   Thao Vanover A. Ulice Brilliant, M.S., OTR/L Pager: [phone]

## 2021-11-13 ENCOUNTER — Emergency Department (HOSPITAL_COMMUNITY): Payer: Medicare Other

## 2021-11-13 ENCOUNTER — Encounter (HOSPITAL_COMMUNITY): Payer: Self-pay

## 2021-11-13 ENCOUNTER — Emergency Department (HOSPITAL_COMMUNITY)
Admission: EM | Admit: 2021-11-13 | Discharge: 2021-11-13 | Disposition: A | Discharge status: Home / Self Care | Payer: Medicare Other | Attending: Emergency Medicine | Admitting: Emergency Medicine

## 2021-11-13 ENCOUNTER — Other Ambulatory Visit: Payer: Self-pay

## 2021-11-13 DIAGNOSIS — R042 Hemoptysis: Secondary | ICD-10-CM

## 2021-11-13 DIAGNOSIS — J40 Bronchitis, not specified as acute or chronic: Secondary | ICD-10-CM | POA: Diagnosis not present

## 2021-11-13 DIAGNOSIS — J181 Lobar pneumonia, unspecified organism: Secondary | ICD-10-CM | POA: Insufficient documentation

## 2021-11-13 DIAGNOSIS — E876 Hypokalemia: Secondary | ICD-10-CM | POA: Insufficient documentation

## 2021-11-13 DIAGNOSIS — J189 Pneumonia, unspecified organism: Secondary | ICD-10-CM

## 2021-11-13 DIAGNOSIS — R059 Cough, unspecified: Secondary | ICD-10-CM | POA: Diagnosis present

## 2021-11-13 DIAGNOSIS — Z7982 Long term (current) use of aspirin: Secondary | ICD-10-CM | POA: Insufficient documentation

## 2021-11-13 DIAGNOSIS — Z91040 Latex allergy status: Secondary | ICD-10-CM | POA: Diagnosis not present

## 2021-11-13 LAB — BASIC METABOLIC PANEL
Anion gap: 7 (ref 5–15)
BUN: 14 mg/dL (ref 8–23)
CO2: 28 mmol/L (ref 22–32)
Calcium: 9.4 mg/dL (ref 8.9–10.3)
Chloride: 106 mmol/L (ref 98–111)
Creatinine, Ser: 0.72 mg/dL (ref 0.44–1.00)
GFR, Estimated: >60 mL/min (ref >60)
Glucose, Bld: 104 mg/dL — ABNORMAL HIGH (ref 70–99)
Potassium: 3.4 mmol/L — ABNORMAL LOW (ref 3.5–5.1)
Sodium: 141 mmol/L (ref 135–145)

## 2021-11-13 LAB — CBC
HCT: 41.4 % (ref 36.0–46.0)
Hemoglobin: 13.8 g/dL (ref 12.0–15.0)
MCH: 31.2 pg (ref 26.0–34.0)
MCHC: 33.3 g/dL (ref 30.0–36.0)
MCV: 93.5 fL (ref 80.0–100.0)
Platelets: 279 10*3/uL (ref 150–400)
RBC: 4.43 MIL/uL (ref 3.87–5.11)
RDW: 12.6 % (ref 11.5–15.5)
WBC: 7 10*3/uL (ref 4.0–10.5)
nRBC: 0 % (ref 0.0–0.2)

## 2021-11-13 LAB — HEPATIC FUNCTION PANEL
ALT: 25 U/L (ref 0–44)
AST: 20 U/L (ref 15–41)
Albumin: 4.6 g/dL (ref 3.5–5.0)
Alkaline Phosphatase: 80 U/L (ref 38–126)
Bilirubin, Direct: 0.2 mg/dL (ref 0.0–0.2)
Indirect Bilirubin: 1 mg/dL — ABNORMAL HIGH (ref 0.3–0.9)
Total Bilirubin: 1.2 mg/dL (ref 0.3–1.2)
Total Protein: 7.8 g/dL (ref 6.5–8.1)

## 2021-11-13 MED ORDER — ALBUTEROL SULFATE HFA 108 (90 BASE) MCG/ACT IN AERS
2.0000 | INHALATION_SPRAY | RESPIRATORY_TRACT | Status: DC | PRN
  Filled 2021-11-13: qty 6.7

## 2021-11-13 MED ORDER — IOHEXOL 350 MG/ML SOLN
80.0000 mL | Freq: Once | INTRAVENOUS | Status: AC | PRN
  Administered 2021-11-13: 80 mL via INTRAVENOUS

## 2021-11-13 NOTE — ED Triage Notes (Signed)
Patient reports that she had recent travel to [place] and while there she was diagnosed with pneumonia and bronchitis.Patient is on Day 5 of a doxycycline regime.  ? ?Today, the patient went to her PCP with c/o hemoptysis and worsening cough. ?Patient was sent to the ED to r/o  PE. 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 ? ?

## 2021-11-13 NOTE — Discharge Instructions (Addendum)
Continue the doxycycline.  Continue the albuterol inhaler continue the steroids.  Make an appointment follow-up with your regular doctor.  Also make an appointment follow-up with pulmonary medicine.  Information provided above.  Work-up here today CT angio chest without evidence of blood clots.  Does show a left lower lobe pneumonia.  Return for any new or worse symptoms.  Return for coughing up 1 cup of pure blood.  Also would recommend Mucinex DM to help break up the the phlegm and help suppress the cough some.  Albuterol inhaler also help suppress the cough. ? ?Also your blood pressures have been a little elevated here.  When you are feeling better follow back up with your primary care doctor to have them trended.  To see if they are back to a normal range. ?

## 2021-11-13 NOTE — ED Provider Notes (Signed)
?[facility] ?Provider Note ? ? ?CSN: [id number] ?Arrival date & time: 11/13/21  1359 ? ?  ? ?History ? ?Chief Complaint  ?Patient presents with  ? Hemoptysis  ? ? ?[name] is a 66 y.o. female. ? ?Patient started to feel as if she had cold over the weekend.  Was feeling fairly poorly on Sunday more so on Monday.  Went to an urgent care up in [place] area where she was staying they did chest x-ray diagnosed with left lower lobe pneumonia and started her on doxycycline.  Also gave her a steroid Dosepak and she uses an albuterol inhaler.  Patient has a history of asthma.  But it has been under good control of late. ? ?Patient started with a productive cough yesterday.  Today she started coughing up blood.  Sort of chunks of blood.  But not pure blood.  Kind of choked her and she had trouble getting her breath.  Went to her primary care doctor.  Primary care doctor referred her here.  Oxygen sats 95% upon arrival and when I was in the room they were 98% on room air.  No wheezing.  Blood pressure 151/94 temp 98.8.  Patient also stated that she had negative COVID and flu testing while at the urgent care on Monday.  Patient's primary care doctor referred her in a certain for concern for pulmonary embolus.  Patient has a remote history of DVT not on any blood thinners at this time.  No nausea vomiting or diarrhea. ? ? ?  ? ?Home Medications ?Prior to Admission medications   ?Medication Sig Start Date End Date Taking? Authorizing Provider  ?aspirin EC 325 MG tablet Take 1 tablet (325 mg total) by mouth 2 (two) times daily. 02/04/17   Porterfield, Museum/gallery conservator, PA-C  ?cyclobenzaprine (FLEXERIL) 10 MG tablet Take 1 tablet (10 mg total) by mouth 3 (three) times daily as needed for muscle spasms. 02/04/17   Porterfield, Amber, PA-C  ?gabapentin (NEURONTIN) 300 MG capsule Take 300 mg by mouth daily.     [provider]  ?metoCLOPramide (REGLAN) 10 MG tablet Take 1 tablet (10 mg total) by  mouth every 8 (eight) hours as needed for nausea. 12/23/16   Porterfield, Museum/gallery conservator, PA-C  ?oxyCODONE (OXY IR/ROXICODONE) 5 MG immediate release tablet Take 1-2 tablets (5-10 mg total) by mouth every 3 (three) hours as needed for moderate pain or severe pain. 02/04/17   Porterfield, Amber, PA-C  ?pantoprazole (PROTONIX) 40 MG tablet Take 40 mg by mouth daily. 11/15/16   [provider]  ?VENTOLIN HFA 108 (90 Base) MCG/ACT inhaler Inhale 1-2 puffs into the lungs every 6 (six) hours as needed for wheezing or shortness of breath. 09/16/16   [provider]  ?   ? ?Allergies    ?Morphine and related, Penicillins, Chlorhexidine, Latex, and Erythromycin   ? ?Review of Systems   ?Review of Systems  ?Constitutional:  Positive for fatigue. Negative for chills and fever.  ?HENT:  Positive for congestion. Negative for ear pain and sore throat.   ?Eyes:  Negative for pain and visual disturbance.  ?Respiratory:  Positive for cough and wheezing. Negative for shortness of breath.   ?Cardiovascular:  Negative for chest pain and palpitations.  ?Gastrointestinal:  Negative for abdominal pain, diarrhea, nausea and vomiting.  ?Genitourinary:  Negative for dysuria and hematuria.  ?Musculoskeletal:  Negative for arthralgias and back pain.  ?Skin:  Negative for color change and rash.  ?Neurological:  Negative for seizures and  syncope.  ?All other systems reviewed and are negative. ? ?Physical Exam ?Updated Vital Signs ?BP (!) 160/108   Pulse 78   Temp 98.8 ?F (37.1 ?C) (Oral)   Resp 14   Ht 1.626 m ('5\' 4"'$ )   Wt 78.9 kg   LMP 08/19/2011   SpO2 96%   BMI 29.87 kg/m?  ?Physical Exam ?Vitals and nursing note reviewed.  ?Constitutional:   ?   General: She is not in acute distress. ?   Appearance: Normal appearance. She is well-developed. She is not ill-appearing or toxic-appearing.  ?HENT:  ?   Head: Normocephalic and atraumatic.  ?   Mouth/Throat:  ?   Mouth: Mucous membranes are moist.  ?Eyes:  ?   Extraocular Movements:  Extraocular movements intact.  ?   Conjunctiva/sclera: Conjunctivae normal.  ?   Pupils: Pupils are equal, round, and reactive to light.  ?Cardiovascular:  ?   Rate and Rhythm: Normal rate and regular rhythm.  ?   Heart sounds: No murmur heard. ?Pulmonary:  ?   Effort: Pulmonary effort is normal. No respiratory distress.  ?   Breath sounds: Normal breath sounds. No stridor. No wheezing, rhonchi or rales.  ?Chest:  ?   Chest wall: No tenderness.  ?Abdominal:  ?   Palpations: Abdomen is soft.  ?   Tenderness: There is no abdominal tenderness.  ?Musculoskeletal:     ?   General: No swelling.  ?   Cervical back: Normal range of motion and neck supple.  ?   Right lower leg: No edema.  ?   Left lower leg: No edema.  ?Skin: ?   General: Skin is warm and dry.  ?   Capillary Refill: Capillary refill takes less than 2 seconds.  ?Neurological:  ?   Mental Status: She is alert.  ?Psychiatric:     ?   Mood and Affect: Mood normal.  ? ? ?ED Results / Procedures / Treatments   ?Labs ?(all labs ordered are listed, but only abnormal results are displayed) ?Labs Reviewed  ?BASIC METABOLIC PANEL - Abnormal; Notable for the following components:  ?    Result Value  ? Potassium 3.4 (*)   ? Glucose, Bld 104 (*)   ? All other components within normal limits  ?HEPATIC FUNCTION PANEL - Abnormal; Notable for the following components:  ? Indirect Bilirubin 1.0 (*)   ? All other components within normal limits  ?CBC  ? ? ?EKG ?EKG Interpretation ? ?Date/Time:  Friday November 13 2021 14:48:04 EDT ?Ventricular Rate:  80 ?PR Interval:  161 ?QRS Duration: 98 ?QT Interval:  376 ?QTC Calculation: 434 ?R Axis:   -13 ?Text Interpretation: Sinus rhythm Confirmed by Fredia Sorrow [phone]) on 11/13/2021 4:10:08 PM ? ?Radiology ?CT Angio Chest PE W and/or Wo Contrast ? ?Result Date: 11/13/2021 ?CLINICAL DATA:  Hemoptysis.  Evaluate for pulmonary embolism. EXAM: CT ANGIOGRAPHY CHEST WITH CONTRAST TECHNIQUE: Multidetector CT imaging of the chest was performed  using the standard protocol during bolus administration of intravenous contrast. Multiplanar CT image reconstructions and MIPs were obtained to evaluate the vascular anatomy. RADIATION DOSE REDUCTION: This exam was performed according to the departmental dose-optimization program which includes automated exposure control, adjustment of the mA and/or kV according to patient size and/or use of iterative reconstruction technique. CONTRAST:  74m OMNIPAQUE IOHEXOL 350 MG/ML SOLN COMPARISON:  None. FINDINGS: Vascular Findings: There is adequate opacification of the pulmonary arterial system with the main pulmonary artery measuring 386 Hounsfield units. There are no  discrete filling defects within the pulmonary arterial tree to suggest pulmonary embolism. Normal caliber of the main pulmonary artery. Cardiomegaly.  No pericardial effusion. No evidence of thoracic aortic aneurysm or dissection. Conventional configuration of the aortic arch. The branch vessels of the aortic arch appear widely patent throughout their imaged courses. Review of the MIP images confirms the above findings. ---------------------------------------------------------------------------------- Nonvascular Findings: Mediastinum/Lymph Nodes: No bulky mediastinal, hilar or axillary lymphadenopathy. Lungs/Pleura: Mild elevation/eventration of the left hemidiaphragm with associated partial atelectasis of the left lower lobe. Minimal dependent subpleural ground-glass atelectasis. Linear geographic consolidative opacities within the left lower lobe (representative images 63 through 78, series 6). No discrete focal airspace opacities. No pleural effusion or pneumothorax. The central pulmonary airways appear widely patent. No discrete pulmonary nodules. Upper abdomen: Limited early arterial phase evaluation of the upper abdomen demonstrates sequela of previous cholecystectomy. There is apparent diffuse decreased attenuation hepatic parenchyma on this  postcontrast examination. Musculoskeletal: No acute or aggressive osseous abnormalities. 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 Regional soft tissues appear normal. Normal appearance of the

## 2021-11-13 NOTE — ED Provider Triage Note (Signed)
Emergency Medicine Provider Triage Evaluation Note ? ?[name] , a 66 y.o. female  was evaluated in triage.  Pt complains of shortness of breath, cough and hemoptysis.  She reports that a week ago she traveled from [place].  Before she left she was having some chills and chest pain.  Her flu and COVID in [place] were negative however her chest x-ray showed pneumonia.  She was placed on doxycycline and drove back from [place] on Wednesday.  Since then she has had episodes of hemoptysis.  At first it was mixed in with her phlegm however today she was coughing up pure blood.  She saw her PCP who told her to come to the emergency department.  Has a history of DVT.  Denies any tobacco use or OCP use however does say her chest pain is worse with deep breath.  No fevers or chills, is on day 5 of Doxy. ? ? ?Review of Systems  ?Positive: Pleuritic chest pain, cough, hemoptysis ?Negative: Fevers or chills ? ?Physical Exam  ?BP (!) 151/94 (BP Location: Left Arm)   Pulse 90   Temp 98.8 ?F (37.1 ?C) (Oral)   Resp 20   Ht '5\' 4"'$  (1.626 m)   Wt 78.9 kg   LMP 08/19/2011   SpO2 95%   BMI 29.87 kg/m?  ?Gen:   Awake, no distress   ?Resp:  Normal effort  ?MSK:   Moves extremities without difficulty  ?Other:  Lung sounds clear, RRR.  Patient showed me her recent hemoptysis in the trash can today which is purely blood, no phlegm ? ?Medical Decision Making  ?Medically screening exam initiated at 2:21 PM.  Appropriate orders placed.  [name] was informed that the remainder of the evaluation will be completed by another provider, this initial triage assessment does not replace that evaluation, and the importance of remaining in the ED until their evaluation is complete. ? ?Lab work and CTA ordered due to risk factors and history ?  ?Rhae Hammock, PA-C ?11/13/21 1437 ? ?

## 2022-06-30 ENCOUNTER — Other Ambulatory Visit: Payer: Self-pay | Admitting: Obstetrics & Gynecology

## 2022-06-30 DIAGNOSIS — R928 Other abnormal and inconclusive findings on diagnostic imaging of breast: Secondary | ICD-10-CM

## 2022-07-15 ENCOUNTER — Ambulatory Visit
Admission: RE | Admit: 2022-07-15 | Discharge: 2022-07-15 | Disposition: A | Discharge status: Home / Self Care | Payer: Medicare Other | Source: Ambulatory Visit | Attending: Obstetrics & Gynecology | Admitting: Obstetrics & Gynecology

## 2022-07-15 ENCOUNTER — Ambulatory Visit: Admission: RE | Admit: 2022-07-15 | Payer: Medicare Other | Source: Ambulatory Visit

## 2022-07-15 DIAGNOSIS — R928 Other abnormal and inconclusive findings on diagnostic imaging of breast: Secondary | ICD-10-CM

## 2022-09-25 IMAGING — CT CT ANGIO CHEST
2 of 6 series · 18 of 36 positions shown · IV contrast (agent unspecified)
Comparison: None.

CLINICAL DATA: Hemoptysis.  Evaluate for pulmonary embolism.

EXAM:
CT ANGIOGRAPHY CHEST WITH CONTRAST
TECHNIQUE: Multidetector CT imaging of the chest was performed using the
standard protocol during bolus administration of intravenous
contrast. Multiplanar CT image reconstructions and MIPs were
obtained to evaluate the vascular anatomy.

[Series 5: thins · axial · 0.65mm/px · z∈[+1289,+1523]mm · 17 of 264 slices shown]
[im 15/264  lung]
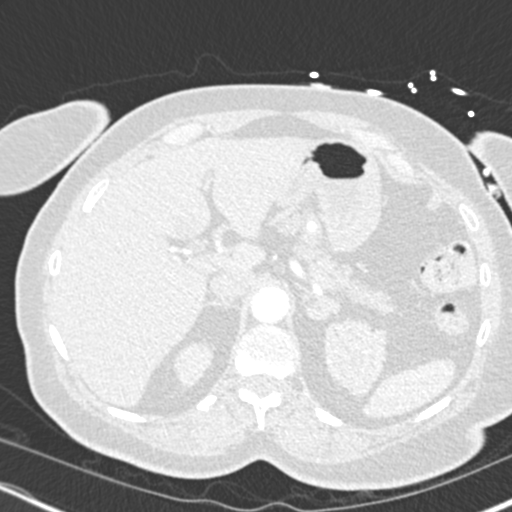
[im 30/264  mediastinal]
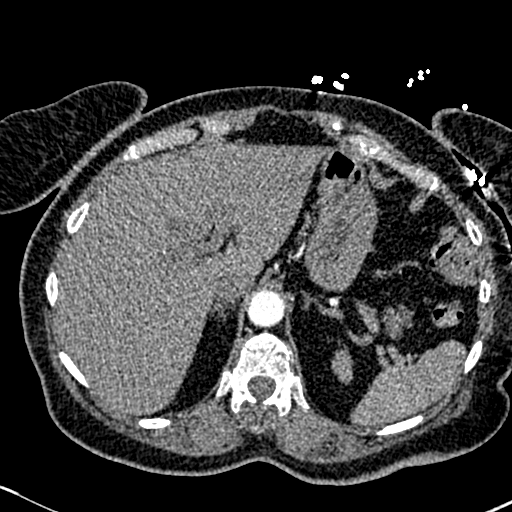
[im 44/264  lung]
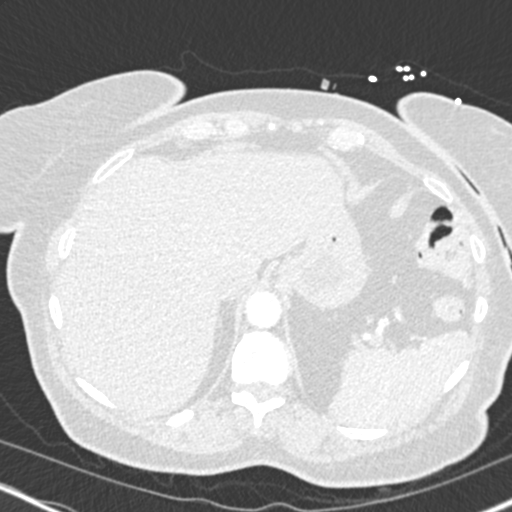
[im 59/264  mediastinal]
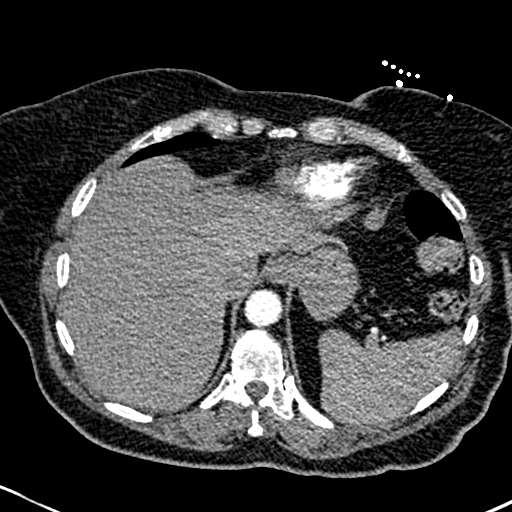
[im 74/264  lung]
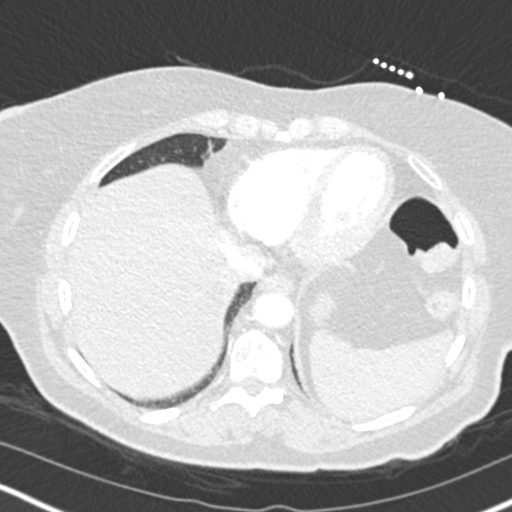
[im 88/264  mediastinal]
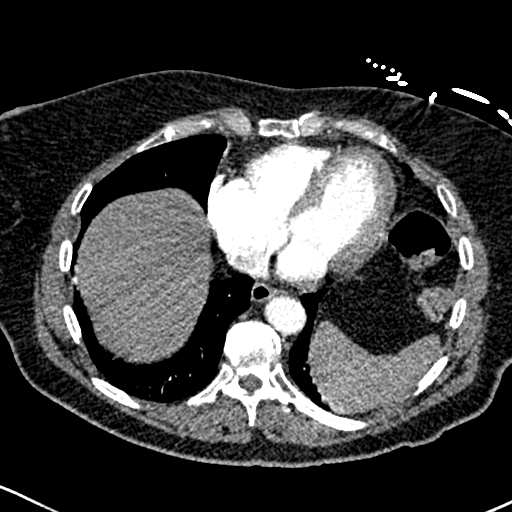
[im 103/264  lung]
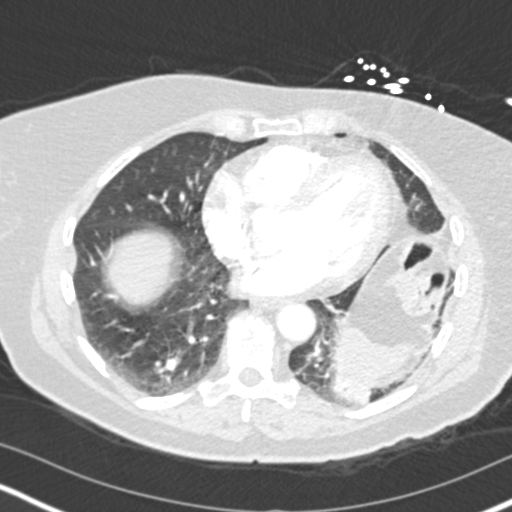
[im 117/264  mediastinal]
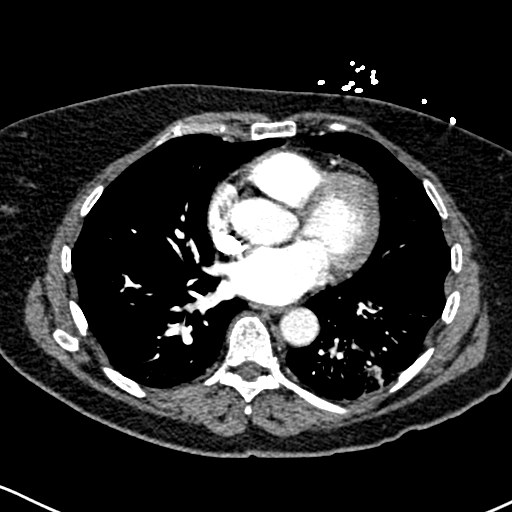
[im 132/264  lung]
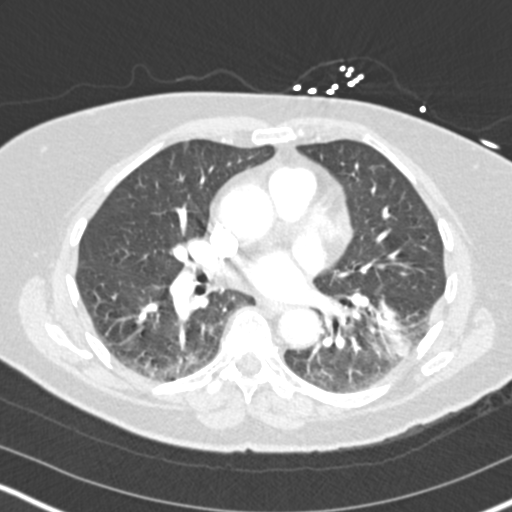
[im 147/264  mediastinal]
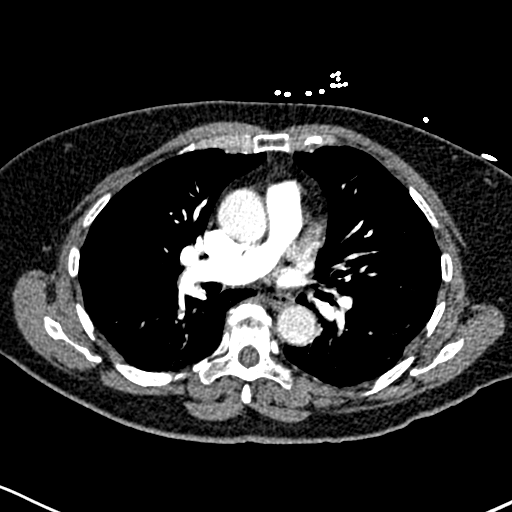
[im 161/264  lung]
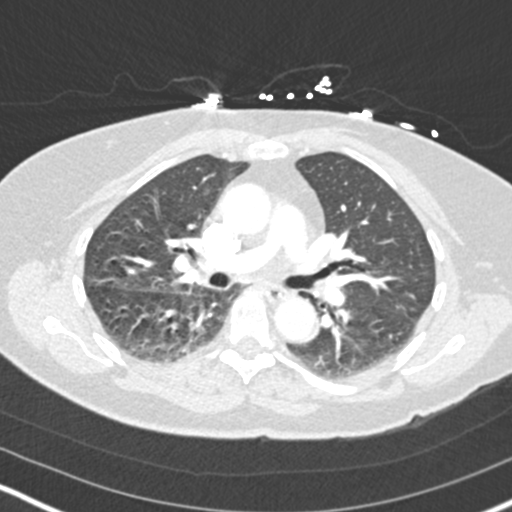
[im 176/264  mediastinal]
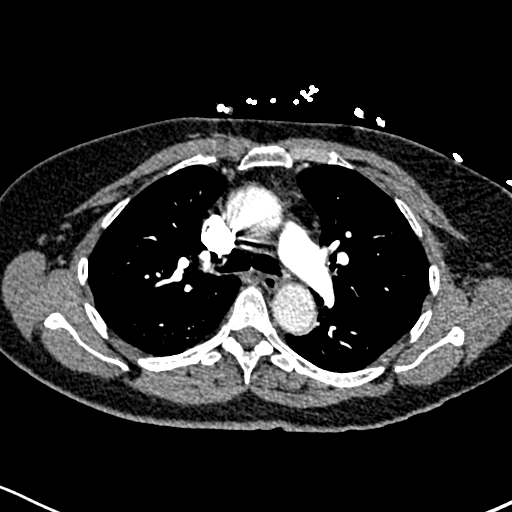
[im 190/264  lung]
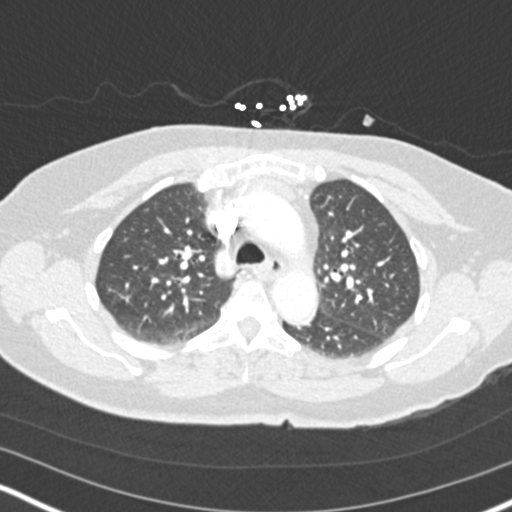
[im 205/264  mediastinal]
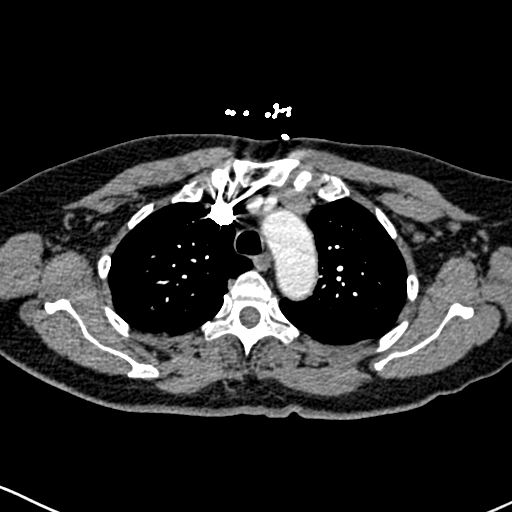
[im 220/264  lung]
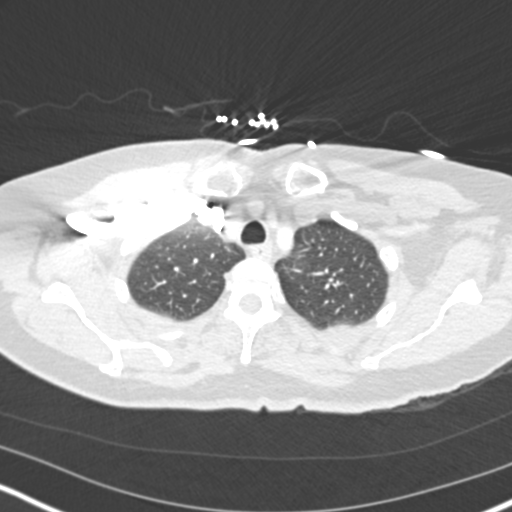
[im 234/264  mediastinal]
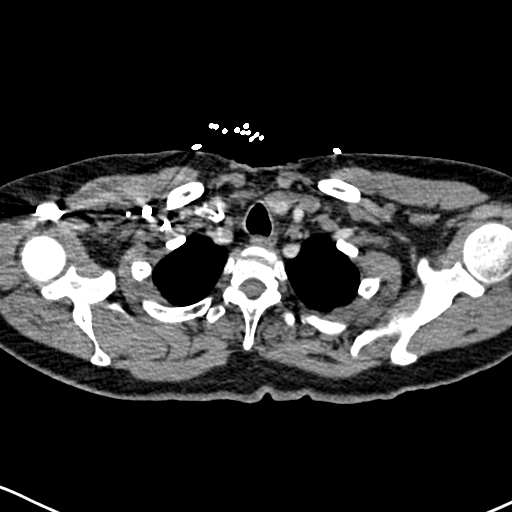
[im 249/264  lung]
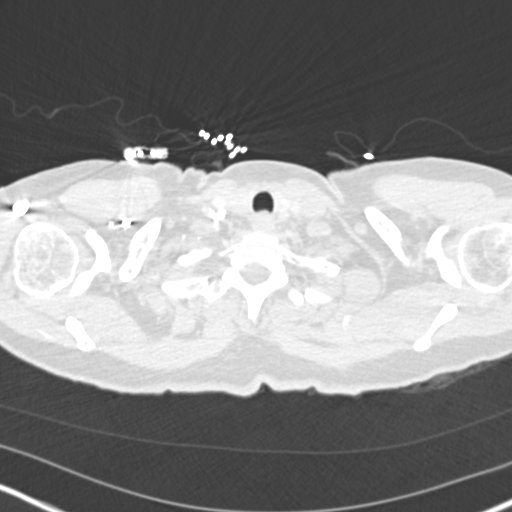

[Series 7: coronal mpr · coronal · 0.53mm/px · 1 of 129 slices shown]
[im 65/129  mediastinal]
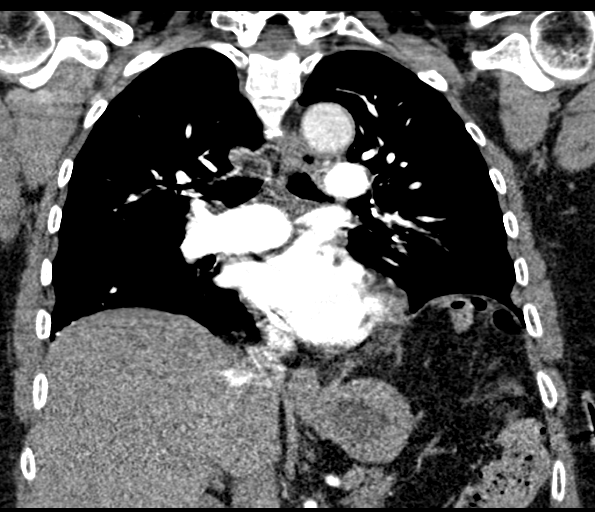

[18 of 36 positions shown; findings below may reference images not displayed]

RADIATION DOSE REDUCTION: This exam was performed according to the
departmental dose-optimization program which includes automated
exposure control, adjustment of the mA and/or kV according to
patient size and/or use of iterative reconstruction technique.

CONTRAST:  80mL OMNIPAQUE IOHEXOL 350 MG/ML SOLN
FINDINGS: Vascular Findings:

There is adequate opacification of the pulmonary arterial system
with the main pulmonary artery measuring 386 Hounsfield units. There
are no discrete filling defects within the pulmonary arterial tree
to suggest pulmonary embolism. Normal caliber of the main pulmonary
artery.

Cardiomegaly.  No pericardial effusion.

No evidence of thoracic aortic aneurysm or dissection. Conventional
configuration of the aortic arch. The branch vessels of the aortic
arch appear widely patent throughout their imaged courses.

Review of the MIP images confirms the above findings.

----------------------------------------------------------------------------------

Nonvascular Findings:

Mediastinum/Lymph Nodes: No bulky mediastinal, hilar or axillary
lymphadenopathy.

Lungs/Pleura: Mild elevation/eventration of the left hemidiaphragm
with associated partial atelectasis of the left lower lobe. Minimal
dependent subpleural ground-glass atelectasis. Linear geographic
consolidative opacities within the left lower lobe (representative
images 63 through 67, series 6). No discrete focal airspace
opacities. No pleural effusion or pneumothorax. The central
pulmonary airways appear widely patent. No discrete pulmonary
nodules.

Upper abdomen: Limited early arterial phase evaluation of the upper
abdomen demonstrates sequela of previous cholecystectomy. There is
apparent diffuse decreased attenuation hepatic parenchyma on this
postcontrast examination.

Musculoskeletal: No acute or aggressive osseous abnormalities.
Stigmata of dish within mid and caudal aspects of the thoracic
spine. Regional soft tissues appear normal. Normal appearance of the
thyroid gland.
IMPRESSION: 1. Linear geographic consolidative opacities within the left lower
lobe, potentially atelectasis in the setting of left diaphragmatic
elevation/eventration, though conceivably developing infection could
have a similar appearance.
2. Otherwise, no explanation for reported history of hemoptysis.
Specifically, no evidence of pulmonary embolism.
3. Borderline cardiomegaly. Further evaluation cardiac echo could as
indicated.
4. Suspected hepatic steatosis.  Correlation with LFTs is advised.

## 2023-05-03 ENCOUNTER — Other Ambulatory Visit: Payer: Self-pay

## 2023-05-03 ENCOUNTER — Emergency Department (HOSPITAL_COMMUNITY)
Admission: EM | Admit: 2023-05-03 | Discharge: 2023-05-04 | Disposition: A | Discharge status: Home / Self Care | Payer: Medicare Other | Attending: Emergency Medicine | Admitting: Emergency Medicine

## 2023-05-03 ENCOUNTER — Emergency Department (HOSPITAL_COMMUNITY): Payer: Medicare Other

## 2023-05-03 ENCOUNTER — Encounter (HOSPITAL_COMMUNITY): Payer: Self-pay

## 2023-05-03 DIAGNOSIS — Z7982 Long term (current) use of aspirin: Secondary | ICD-10-CM | POA: Diagnosis not present

## 2023-05-03 DIAGNOSIS — R109 Unspecified abdominal pain: Secondary | ICD-10-CM | POA: Diagnosis present

## 2023-05-03 DIAGNOSIS — I10 Essential (primary) hypertension: Secondary | ICD-10-CM | POA: Diagnosis not present

## 2023-05-03 DIAGNOSIS — Z91040 Latex allergy status: Secondary | ICD-10-CM | POA: Insufficient documentation

## 2023-05-03 LAB — CBC WITH DIFFERENTIAL/PLATELET
Abs Immature Granulocytes: 0.05 10*3/uL (ref 0.00–0.07)
Basophils Absolute: 0 10*3/uL (ref 0.0–0.1)
Basophils Relative: 0 %
Eosinophils Absolute: 0 10*3/uL (ref 0.0–0.5)
Eosinophils Relative: 0 %
HCT: 40.6 % (ref 36.0–46.0)
Hemoglobin: 14 g/dL (ref 12.0–15.0)
Immature Granulocytes: 1 %
Lymphocytes Relative: 10 %
Lymphs Abs: 0.7 10*3/uL (ref 0.7–4.0)
MCH: 32.3 pg (ref 26.0–34.0)
MCHC: 34.5 g/dL (ref 30.0–36.0)
MCV: 93.8 fL (ref 80.0–100.0)
Monocytes Absolute: 0.1 10*3/uL (ref 0.1–1.0)
Monocytes Relative: 1 %
Neutro Abs: 6.6 10*3/uL (ref 1.7–7.7)
Neutrophils Relative %: 88 %
Platelets: 277 10*3/uL (ref 150–400)
RBC: 4.33 MIL/uL (ref 3.87–5.11)
RDW: 12.3 % (ref 11.5–15.5)
WBC: 7.5 10*3/uL (ref 4.0–10.5)
nRBC: 0 % (ref 0.0–0.2)

## 2023-05-03 LAB — URINALYSIS, ROUTINE W REFLEX MICROSCOPIC
Bilirubin Urine: NEGATIVE
Glucose, UA: NEGATIVE mg/dL
Ketones, ur: NEGATIVE mg/dL
Nitrite: NEGATIVE
Protein, ur: 100 mg/dL — AB
RBC / HPF: >50 RBC/hpf (ref 0–5)
Specific Gravity, Urine: 1.005 (ref 1.005–1.030)
pH: 7 (ref 5.0–8.0)

## 2023-05-03 LAB — COMPREHENSIVE METABOLIC PANEL
ALT: 29 U/L (ref 0–44)
AST: 31 U/L (ref 15–41)
Albumin: 4.7 g/dL (ref 3.5–5.0)
Alkaline Phosphatase: 79 U/L (ref 38–126)
Anion gap: 12 (ref 5–15)
BUN: 10 mg/dL (ref 8–23)
CO2: 24 mmol/L (ref 22–32)
Calcium: 9.4 mg/dL (ref 8.9–10.3)
Chloride: 104 mmol/L (ref 98–111)
Creatinine, Ser: 0.92 mg/dL (ref 0.44–1.00)
GFR, Estimated: >60 mL/min (ref >60)
Glucose, Bld: 149 mg/dL — ABNORMAL HIGH (ref 70–99)
Potassium: 3.6 mmol/L (ref 3.5–5.1)
Sodium: 140 mmol/L (ref 135–145)
Total Bilirubin: 1 mg/dL (ref 0.3–1.2)
Total Protein: 8.3 g/dL — ABNORMAL HIGH (ref 6.5–8.1)

## 2023-05-03 MED ORDER — HYDROMORPHONE HCL 1 MG/ML IJ SOLN: 1.0000 mg | Freq: Once | INTRAMUSCULAR | Status: DC

## 2023-05-03 MED ORDER — DIAZEPAM 5 MG/ML IJ SOLN
5.0000 mg | Freq: Once | INTRAMUSCULAR | Status: AC
  Administered 2023-05-03: 5 mg via INTRAVENOUS
  Filled 2023-05-03: qty 2

## 2023-05-03 MED ORDER — ONDANSETRON HCL 4 MG/2ML IJ SOLN
4.0000 mg | Freq: Once | INTRAMUSCULAR | Status: AC
  Administered 2023-05-03: 4 mg via INTRAVENOUS
  Filled 2023-05-03: qty 2

## 2023-05-03 MED ORDER — HYDROMORPHONE HCL 1 MG/ML IJ SOLN
1.0000 mg | Freq: Once | INTRAMUSCULAR | Status: AC
  Administered 2023-05-03: 1 mg via INTRAVENOUS
  Filled 2023-05-03: qty 1

## 2023-05-03 MED ORDER — KETOROLAC TROMETHAMINE 30 MG/ML IJ SOLN
30.0000 mg | Freq: Once | INTRAMUSCULAR | Status: AC
  Administered 2023-05-03: 30 mg via INTRAVENOUS
  Filled 2023-05-03: qty 1

## 2023-05-03 MED ORDER — OXYCODONE HCL 5 MG PO TABS: 10.0000 mg | ORAL_TABLET | ORAL | Status: DC | PRN

## 2023-05-03 MED ORDER — ONDANSETRON 4 MG PO TBDP: 4.0000 mg | ORAL_TABLET | Freq: Three times a day (TID) | ORAL | Status: DC | PRN

## 2023-05-03 MED ORDER — SODIUM CHLORIDE 0.9 % IV BOLUS
1000.0000 mL | Freq: Once | INTRAVENOUS | Status: AC
  Administered 2023-05-03: 1000 mL via INTRAVENOUS

## 2023-05-03 NOTE — ED Provider Notes (Signed)
[place] EMERGENCY DEPARTMENT AT [facility] Provider Note   CSN: [id number] Arrival date & time: 05/03/23  1830     History  Chief Complaint  Patient presents with   Post-op Problem   Flank Pain   Nausea    [name] is a 67 y.o. female.  [name] is a 67 yo F who underwent R-sided laser lithotripsy and stent placement in [facility] this morning and is now complaining of severe R flank pain. She states that she had moderate pain when she left from her procedure this morning that got acutely worse about one hour after she returned home, rating 10/10. She has had associated nausea and retching without emesis as she has not eaten today. The pain is very similar to the pain she had with her stones, being at the R flank under the ribs and there is a sensation of something "getting stuck." She reports urination about 10 times today and there is burning and bright red blood. She was given tylenol for pain but also has prescription for oxycodone due to spondylolisthesis and a 5mg  dose of oxycodone as well as flexeril did not improve her pain. She denies chest pain, dyspnea, pain with breathing, diarrhea, fever, chills.   Flank Pain Associated symptoms include abdominal pain. Pertinent negatives include no chest pain, no headaches and no shortness of breath.       Home Medications Prior to Admission medications   Medication Sig Start Date End Date Taking? Authorizing Provider  aspirin EC 325 MG tablet Take 1 tablet (325 mg total) by mouth 2 (two) times daily. 02/04/17   Porterfield, Amber, PA-C  cyclobenzaprine (FLEXERIL) 10 MG tablet Take 1 tablet (10 mg total) by mouth 3 (three) times daily as needed for muscle spasms. 02/04/17   Porterfield, Amber, PA-C  gabapentin (NEURONTIN) 300 MG capsule Take 300 mg by mouth daily.     [provider]  metoCLOPramide (REGLAN) 10 MG tablet Take 1 tablet (10 mg total) by mouth every 8 (eight) hours as needed for nausea.  12/23/16   Porterfield, Amber, PA-C  oxyCODONE (OXY IR/ROXICODONE) 5 MG immediate release tablet Take 1-2 tablets (5-10 mg total) by mouth every 3 (three) hours as needed for moderate pain or severe pain. 02/04/17   Porterfield, Amber, PA-C  pantoprazole (PROTONIX) 40 MG tablet Take 40 mg by mouth daily. 11/15/16   [provider]  VENTOLIN HFA 108 (90 Base) MCG/ACT inhaler Inhale 1-2 puffs into the lungs every 6 (six) hours as needed for wheezing or shortness of breath. 09/16/16   [provider]      Allergies    Morphine and codeine, Penicillins, Chlorhexidine, Latex, and Erythromycin    Review of Systems   Review of Systems  Constitutional:  Negative for chills, fatigue and fever.  Respiratory:  Negative for shortness of breath.   Cardiovascular:  Negative for chest pain and palpitations.  Gastrointestinal:  Positive for abdominal pain and nausea. Negative for abdominal distention, constipation and diarrhea.  Genitourinary:  Positive for dysuria, flank pain, frequency and hematuria. Negative for decreased urine volume, difficulty urinating, enuresis and pelvic pain.  Musculoskeletal:  Positive for back pain.  Skin:  Negative for wound.  Neurological:  Negative for dizziness, numbness and headaches.  Psychiatric/Behavioral:  Negative for confusion.     Physical Exam Updated Vital Signs BP 135/80   Pulse 77   Temp 99.1 F (37.3 C) (Oral)   Resp 16   Ht 5\' 4"  (1.626 m)  Wt 78.9 kg   LMP 08/19/2011   SpO2 99%   BMI 29.86 kg/m  Physical Exam Constitutional:      General: She is not in acute distress.    Appearance: Normal appearance. She is ill-appearing.  HENT:     Head: Normocephalic and atraumatic.  Cardiovascular:     Rate and Rhythm: Normal rate and regular rhythm.     Pulses: Normal pulses.     Heart sounds: Normal heart sounds.  Pulmonary:     Effort: Pulmonary effort is normal. No respiratory distress.     Breath sounds: Normal breath sounds. No  wheezing.  Abdominal:     General: Abdomen is flat. There is no distension.     Palpations: Abdomen is soft.     Tenderness: There is abdominal tenderness. There is no right CVA tenderness, left CVA tenderness or rebound.  Musculoskeletal:     Right lower leg: No edema.     Left lower leg: No edema.  Skin:    General: Skin is warm and dry.  Neurological:     General: No focal deficit present.     Mental Status: She is alert and oriented to person, place, and time.  Psychiatric:        Mood and Affect: Mood normal.        Behavior: Behavior normal.     ED Results / Procedures / Treatments   Labs (all labs ordered are listed, but only abnormal results are displayed) Labs Reviewed  COMPREHENSIVE METABOLIC PANEL - Abnormal; Notable for the following components:      Result Value   Glucose, Bld 149 (*)    Total Protein 8.3 (*)    All other components within normal limits  URINALYSIS, ROUTINE W REFLEX MICROSCOPIC - Abnormal; Notable for the following components:   Hgb urine dipstick LARGE (*)    Protein, ur 100 (*)    Leukocytes,Ua SMALL (*)    Bacteria, UA RARE (*)    All other components within normal limits  CBC WITH DIFFERENTIAL/PLATELET    EKG None  Radiology CT Renal Stone Study  Result Date: 05/03/2023 CLINICAL DATA:  Abdominal pain.  Concern for kidney stone. EXAM: CT ABDOMEN AND PELVIS WITHOUT CONTRAST TECHNIQUE: Multidetector CT imaging of the abdomen and pelvis was performed following the standard protocol without IV contrast. RADIATION DOSE REDUCTION: This exam was performed according to the departmental dose-optimization program which includes automated exposure control, adjustment of the mA and/or kV according to patient size and/or use of iterative reconstruction technique. COMPARISON:  CT abdomen pelvis dated 11/13/2015. FINDINGS: Evaluation of this exam is limited in the absence of intravenous contrast. Lower chest: Mild eventration of the left hemidiaphragm.  The visualized lung bases are otherwise clear. No intra-abdominal free air or free fluid. Hepatobiliary: The liver is unremarkable. No biliary dilatation. Cholecystectomy. No retained calcified stone noted in the central CBD. Pancreas: Unremarkable. No pancreatic ductal dilatation or surrounding inflammatory changes. Spleen: Normal in size without focal abnormality. Adrenals/Urinary Tract: The adrenal glands unremarkable. Nonobstructing right renal calculi measure up to approximately 15 mm in the inferior pole. No hydronephrosis. There is a pigtail right ureteral stent with proximal tip in the upper pole of the right kidney and distal end within the urinary bladder. No stone noted along the course of the stent. There is a 3.8 x 3.5 cm predominantly exophytic solid-appearing mass arising from the posterior interpolar right kidney concerning for a neoplasm. Further evaluation with MRI without and with contrast is recommended.  Small right renal inferior pole cyst. The left kidney is unremarkable. The visualized left ureter and urinary bladder appear unremarkable. Tiny pocket of air within the urinary bladder likely introduced via recent instrumentation. Stomach/Bowel: There is sigmoid diverticulosis without active inflammatory changes. There is moderate stool throughout the colon. No bowel obstruction or active inflammation. The appendix is normal. Vascular/Lymphatic: Mild aortoiliac atherosclerotic disease. The IVC is unremarkable. No portal venous gas. There is no adenopathy. Reproductive: Hysterectomy.  No adnexal masses. Other: None Musculoskeletal: Degenerative changes of the spine. No acute osseous pathology. IMPRESSION: 1. Nonobstructing right renal calculi. No hydronephrosis. 2. Right ureteral stent in place. 3. A 3.8 x 3.5 cm predominantly exophytic solid-appearing mass arising from the posterior interpolar right kidney concerning for a neoplasm. Further evaluation with MRI without and with contrast is  recommended. 4. Sigmoid diverticulosis. No bowel obstruction. Normal appendix. 5.  Aortic Atherosclerosis (ICD10-I70.0). Electronically Signed   By: Elgie Collard M.D.   On: 05/03/2023 22:34    Procedures Procedures    Medications Ordered in ED Medications  oxyCODONE (Oxy IR/ROXICODONE) immediate release tablet 10 mg (has no administration in time range)  ondansetron (ZOFRAN-ODT) disintegrating tablet 4 mg (has no administration in time range)  HYDROmorphone (DILAUDID) injection 1 mg (1 mg Intravenous Given 05/03/23 2021)  ondansetron (ZOFRAN) injection 4 mg (4 mg Intravenous Given 05/03/23 2021)  sodium chloride 0.9 % bolus 1,000 mL (0 mLs Intravenous Stopped 05/03/23 2210)  sodium chloride 0.9 % bolus 1,000 mL (1,000 mLs Intravenous New Bag/Given 05/03/23 2212)  ondansetron (ZOFRAN) injection 4 mg (4 mg Intravenous Given 05/03/23 2245)  HYDROmorphone (DILAUDID) injection 1 mg (1 mg Intravenous Given 05/03/23 2246)  ketorolac (TORADOL) 30 MG/ML injection 30 mg (30 mg Intravenous Given 05/03/23 2321)  diazepam (VALIUM) injection 5 mg (5 mg Intravenous Given 05/03/23 2321)    ED Course/ Medical Decision Making/ A&P                                 Medical Decision Making Pt on post-op day 0 presenting with severe R flank pain after R lithotripsy and stent placement. At initial assessment she was hypertensive in setting of severe pain but vitals otherwise stable. Pain control provided satisfactorily with dilaudid. Blood count and chemistry unremarkable. UA consistent with recent lithotripsy, no nitrates. CT scan does not reveal acute urologic issue as the stent is in place and there are no obstructing stones or hydronephrosis. She does have a known R renal mass that will be cared for by her urologist. Overall patient appears stable but with is requiring ongoing pain control for which she is very nervous about returning home. Discussed admission for pain control at [facility] system with Dr.  Wilson Singer as her surgery was completed by Dr Eliseo Gum today but they do not feel she is a candidate for admission there for pain control alone without other acute urologic issues. Discussed possible admission at this facility with Drs Marlou Porch with urology but concerned about accepting her given lithotripsy was done in [facility] today, though local urology is happy to see her as outpatient. Discussed admission with Dr. Julian Reil with hospital medicine but does not feel she is candidate for admission to the service as this is a post-op issue. Plan to keep her in ED for observation overnight. Will plan to shortly transition her to her po home pain regimen and advance diet with likely discharge to home tomorrow. Will hand off patient to overnight  team.  Amount and/or Complexity of Data Reviewed Labs: ordered. Radiology: ordered.  Risk Prescription drug management.          Final Clinical Impression(s) / ED Diagnoses Final diagnoses:  None    Rx / DC Orders ED Discharge Orders     None         Katheran James, DO 05/03/23 2337    Charlynne Pander, MD 05/03/23 [phone]

## 2023-05-03 NOTE — ED Provider Notes (Signed)
Care of patient assumed from Dr. Silverio Lay. Lithotripsy today. Comes in with postoperative pain. Urine pending. Pain is controlled. Spoke with [facility] urology who recommends pain control only. [facility] uro and hospitalist say no admission. On 1-2 tabs of 10mg  oxy at home.  Physical Exam  BP 135/80   Pulse 77   Temp 99.1 F (37.3 C) (Oral)   Resp 16   Ht 5\' 4"  (1.626 m)   Wt 78.9 kg   LMP 08/19/2011   SpO2 99%   BMI 29.86 kg/m   Physical Exam  Procedures  Procedures  ED Course / MDM    Medical Decision Making Amount and/or Complexity of Data Reviewed Labs: ordered. Radiology: ordered.  Risk Prescription drug management.   ***

## 2023-05-03 NOTE — ED Triage Notes (Signed)
Pt arrived to ED reporting she had a lithotripsy procedure earlier today. Was told by surgeon they blasted the two stones on the R side. Patient returns c/o excruciating R flank pain since. Endorses 10/10 pain. Reports intermittent nausea. No other symptoms reported

## 2023-05-04 NOTE — Discharge Instructions (Signed)
Take pain medications at home as needed.  Follow-up with your urologist as scheduled.  Return to the emergency department for any new or worsening symptoms of concern.

## 2023-06-28 ENCOUNTER — Other Ambulatory Visit: Payer: Self-pay | Admitting: Urology

## 2023-08-02 NOTE — Progress Notes (Addendum)
Anesthesia Review:  PCP: Reubin Milan LOV 07/05/23  Cardiologist : none  Chest x-ray : EKG : Echo : Stress test: Cardiac Cath :  Activity level: can do a flight of stairs without difficutly  Sleep Study/ CPAP : none  Fasting Blood Sugar :      / Checks Blood Sugar -- times a day:   Blood Thinner/ Instructions /Last Dose: ASA / Instructions/ Last Dose :    07/05/23- OV with DR Clide Cliff- URI- neg for covid, flu and strep PT states at preop " IT took me 3 weeks to get over.  MD finally had to give me Doxycycline to recover.  I feel great now".  PT has had no followup with MD.     PT reports a severe dry mouth and is aware npo after midnitenite before surgery.  She wll discuss with DR Liliane Shi at preop appt on 1/21@5  at 1530.     CHG Allergy- pt to use only Dove Soap  Latex Allergy

## 2023-08-03 ENCOUNTER — Other Ambulatory Visit: Payer: Self-pay | Admitting: Urology

## 2023-08-03 NOTE — Patient Instructions (Addendum)
SURGICAL WAITING ROOM VISITATION  Patients having surgery or a procedure may have no more than 2 support people in the waiting area - these visitors may rotate.    Children under the age of 108 must have an adult with them who is not the patient.  Due to an increase in RSV and influenza rates and associated hospitalizations, children ages 20 and under may not visit patients in [facility] hospitals.  Visitors with respiratory illnesses are discouraged from visiting and should remain at home.  If the patient needs to stay at the hospital during part of their recovery, the visitor guidelines for inpatient rooms apply. Pre-op nurse will coordinate an appropriate time for 1 support person to accompany patient in pre-op.  This support person may not rotate.    Please refer to the [facility] website for the visitor guidelines for Inpatients (after your surgery is over and you are in a regular room).       Your procedure is scheduled on: 08/12/2023    Report to [facility] Main Entrance    Report to admitting at   1200 noon    Call this number if you have problems the morning of surgery [phone]   Clear liquid diet the day before surgery.              Nothing after midnite.     Water,     Apple juice, white grape juice, white  cranberry juice    Coffee and tea with no milk, cream or creamer, sugar and sweetener ok    Plain jello- no red   Fruit ices or popsicles with no fruit pulp and no red   Gatorade- no red                             If you have questions, please contact your surgeon's office.   FOLLOW BOWEL PREP AND ANY ADDITIONAL PRE OP INSTRUCTIONS YOU RECEIVED FROM YOUR SURGEON'S OFFICE!!!     Oral Hygiene is also important to reduce your risk of infection.                                    Remember - BRUSH YOUR TEETH THE MORNING OF SURGERY WITH YOUR REGULAR TOOTHPASTE  DENTURES WILL BE REMOVED PRIOR TO SURGERY PLEASE DO NOT APPLY "Poly grip" OR  ADHESIVES!!!   Do NOT smoke after Midnight   Stop all vitamins and herbal supplements 7 days before surgery.   Take these medicines the morning of surgery with A SIP OF WATER:  protonix, inhalers as usual and bring   DO NOT TAKE ANY ORAL DIABETIC MEDICATIONS DAY OF YOUR SURGERY  Bring CPAP mask and tubing day of surgery.                              You may not have any metal on your body including hair pins, jewelry, and body piercing             Do not wear make-up, lotions, powders, perfumes/cologne, or deodorant  Do not wear nail polish including gel and S&S, artificial/acrylic nails, or any other type of covering on natural nails including finger and toenails. If you have artificial nails, gel coating, etc. that needs to be removed by a nail salon please have this removed  prior to surgery or surgery may need to be canceled/ delayed if the surgeon/ anesthesia feels like they are unable to be safely monitored.   Do not shave  48 hours prior to surgery.               Men may shave face and neck.   Do not bring valuables to the hospital. [facility] IS NOT             RESPONSIBLE   FOR VALUABLES.   Contacts, glasses, dentures or bridgework may not be worn into surgery.   Bring small overnight bag day of surgery.   DO NOT BRING YOUR HOME MEDICATIONS TO THE HOSPITAL. PHARMACY WILL DISPENSE MEDICATIONS LISTED ON YOUR MEDICATION LIST TO YOU DURING YOUR ADMISSION IN THE HOSPITAL!    Patients discharged on the day of surgery will not be allowed to drive home.  Someone NEEDS to stay with you for the first 24 hours after anesthesia.   Special Instructions: Bring a copy of your healthcare power of attorney and living will documents the day of surgery if you haven't scanned them before.              Please read over the following fact sheets you were given: IF YOU HAVE QUESTIONS ABOUT YOUR PRE-OP INSTRUCTIONS PLEASE CALL [phone]   If you received a COVID test during your pre-op  visit  it is requested that you wear a mask when out in public, stay away from anyone that may not be feeling well and notify your surgeon if you develop symptoms. If you test positive for Covid or have been in contact with anyone that has tested positive in the last 10 days please notify you surgeon.    [place] - Preparing for Surgery Before surgery, you can play an important role.  Because skin is not sterile, your skin needs to be as free of germs as possible.  You can reduce the number of germs on your skin by washing with CHG (chlorahexidine gluconate) soap before surgery.  CHG is an antiseptic cleaner which kills germs and bonds with the skin to continue killing germs even after washing. Please DO NOT use if you have an allergy to CHG or antibacterial soaps.  If your skin becomes reddened/irritated stop using the CHG and inform your nurse when you arrive at Short Stay. Do not shave (including legs and underarms) for at least 48 hours prior to the first CHG shower.  You may shave your face/neck. Please follow these instructions carefully:  1.  Shower with CHG Soap the night before surgery and the  morning of Surgery.  2.  If you choose to wash your hair, wash your hair first as usual with your  normal  shampoo.  3.  After you shampoo, rinse your hair and body thoroughly to remove the  shampoo.                           4.  Use CHG as you would any other liquid soap.  You can apply chg directly  to the skin and wash                       Gently with a scrungie or clean washcloth.  5.  Apply the CHG Soap to your body ONLY FROM THE NECK DOWN.   Do not use on face/ open  Wound or open sores. Avoid contact with eyes, ears mouth and genitals (private parts).                       Wash face,  Genitals (private parts) with your normal soap.             6.  Wash thoroughly, paying special attention to the area where your surgery  will be performed.  7.  Thoroughly rinse your  body with warm water from the neck down.  8.  DO NOT shower/wash with your normal soap after using and rinsing off  the CHG Soap.                9.  Pat yourself dry with a clean towel.            10.  Wear clean pajamas.            11.  Place clean sheets on your bed the night of your first shower and do not  sleep with pets. Day of Surgery : Do not apply any lotions/deodorants the morning of surgery.  Please wear clean clothes to the hospital/surgery center.  FAILURE TO FOLLOW THESE INSTRUCTIONS MAY RESULT IN THE CANCELLATION OF YOUR SURGERY PATIENT SIGNATURE_________________________________  NURSE SIGNATURE__________________________________  ________________________________________________________________________

## 2023-08-09 ENCOUNTER — Other Ambulatory Visit: Payer: Self-pay

## 2023-08-09 ENCOUNTER — Encounter (HOSPITAL_COMMUNITY)
Admission: RE | Admit: 2023-08-09 | Discharge: 2023-08-09 | Disposition: A | Discharge status: Home / Self Care | Payer: PPO | Source: Ambulatory Visit | Attending: Urology | Admitting: Urology

## 2023-08-09 ENCOUNTER — Encounter (HOSPITAL_COMMUNITY): Payer: Self-pay

## 2023-08-09 DIAGNOSIS — Z01812 Encounter for preprocedural laboratory examination: Secondary | ICD-10-CM | POA: Insufficient documentation

## 2023-08-09 HISTORY — DX: Pneumonia, unspecified organism: J18.9

## 2023-08-09 HISTORY — DX: Malignant (primary) neoplasm, unspecified: C80.1

## 2023-08-09 LAB — CBC
HCT: 40.5 % (ref 36.0–46.0)
Hemoglobin: 13.6 g/dL (ref 12.0–15.0)
MCH: 31.8 pg (ref 26.0–34.0)
MCHC: 33.6 g/dL (ref 30.0–36.0)
MCV: 94.6 fL (ref 80.0–100.0)
Platelets: 252 10*3/uL (ref 150–400)
RBC: 4.28 MIL/uL (ref 3.87–5.11)
RDW: 13.4 % (ref 11.5–15.5)
WBC: 7.2 10*3/uL (ref 4.0–10.5)
nRBC: 0 % (ref 0.0–0.2)

## 2023-08-09 LAB — BASIC METABOLIC PANEL
Anion gap: 10 (ref 5–15)
BUN: 9 mg/dL (ref 8–23)
CO2: 24 mmol/L (ref 22–32)
Calcium: 9.7 mg/dL (ref 8.9–10.3)
Chloride: 108 mmol/L (ref 98–111)
Creatinine, Ser: 0.85 mg/dL (ref 0.44–1.00)
GFR, Estimated: >60 mL/min (ref >60)
Glucose, Bld: 108 mg/dL — ABNORMAL HIGH (ref 70–99)
Potassium: 3.7 mmol/L (ref 3.5–5.1)
Sodium: 142 mmol/L (ref 135–145)

## 2023-08-12 ENCOUNTER — Inpatient Hospital Stay (HOSPITAL_COMMUNITY)
Admission: RE | Admit: 2023-08-12 | Discharge: 2023-08-15 | DRG: 658 | Disposition: A | Discharge status: Home / Self Care | Payer: PPO | Source: Ambulatory Visit | Attending: Urology | Admitting: Urology

## 2023-08-12 ENCOUNTER — Other Ambulatory Visit: Payer: Self-pay

## 2023-08-12 ENCOUNTER — Ambulatory Visit (HOSPITAL_COMMUNITY): Payer: PPO | Admitting: Medical

## 2023-08-12 ENCOUNTER — Encounter (HOSPITAL_COMMUNITY)
Admission: RE | Disposition: A | Discharge status: Home / Self Care | Payer: Self-pay | Source: Ambulatory Visit | Attending: Urology

## 2023-08-12 ENCOUNTER — Encounter (HOSPITAL_COMMUNITY): Payer: Self-pay | Admitting: Urology

## 2023-08-12 ENCOUNTER — Ambulatory Visit (HOSPITAL_COMMUNITY): Payer: PPO | Admitting: Anesthesiology

## 2023-08-12 DIAGNOSIS — N2889 Other specified disorders of kidney and ureter: Secondary | ICD-10-CM | POA: Diagnosis not present

## 2023-08-12 DIAGNOSIS — E785 Hyperlipidemia, unspecified: Secondary | ICD-10-CM | POA: Diagnosis present

## 2023-08-12 DIAGNOSIS — Z87891 Personal history of nicotine dependence: Secondary | ICD-10-CM

## 2023-08-12 DIAGNOSIS — C641 Malignant neoplasm of right kidney, except renal pelvis: Principal | ICD-10-CM | POA: Diagnosis present

## 2023-08-12 DIAGNOSIS — N281 Cyst of kidney, acquired: Secondary | ICD-10-CM | POA: Diagnosis present

## 2023-08-12 DIAGNOSIS — J45909 Unspecified asthma, uncomplicated: Secondary | ICD-10-CM | POA: Diagnosis present

## 2023-08-12 DIAGNOSIS — K5900 Constipation, unspecified: Secondary | ICD-10-CM | POA: Diagnosis present

## 2023-08-12 DIAGNOSIS — Z96651 Presence of right artificial knee joint: Secondary | ICD-10-CM | POA: Diagnosis present

## 2023-08-12 DIAGNOSIS — G8929 Other chronic pain: Secondary | ICD-10-CM | POA: Diagnosis present

## 2023-08-12 DIAGNOSIS — Z880 Allergy status to penicillin: Secondary | ICD-10-CM

## 2023-08-12 DIAGNOSIS — E7800 Pure hypercholesterolemia, unspecified: Secondary | ICD-10-CM | POA: Diagnosis present

## 2023-08-12 DIAGNOSIS — Z801 Family history of malignant neoplasm of trachea, bronchus and lung: Secondary | ICD-10-CM

## 2023-08-12 DIAGNOSIS — E739 Lactose intolerance, unspecified: Secondary | ICD-10-CM | POA: Diagnosis present

## 2023-08-12 DIAGNOSIS — Z79899 Other long term (current) drug therapy: Secondary | ICD-10-CM

## 2023-08-12 DIAGNOSIS — K219 Gastro-esophageal reflux disease without esophagitis: Secondary | ICD-10-CM | POA: Diagnosis present

## 2023-08-12 DIAGNOSIS — Z885 Allergy status to narcotic agent status: Secondary | ICD-10-CM

## 2023-08-12 DIAGNOSIS — Z9049 Acquired absence of other specified parts of digestive tract: Secondary | ICD-10-CM

## 2023-08-12 DIAGNOSIS — Z8051 Family history of malignant neoplasm of kidney: Secondary | ICD-10-CM

## 2023-08-12 DIAGNOSIS — Z800 Family history of malignant neoplasm of digestive organs: Secondary | ICD-10-CM

## 2023-08-12 DIAGNOSIS — Z881 Allergy status to other antibiotic agents status: Secondary | ICD-10-CM

## 2023-08-12 DIAGNOSIS — Z888 Allergy status to other drugs, medicaments and biological substances status: Secondary | ICD-10-CM

## 2023-08-12 DIAGNOSIS — Z833 Family history of diabetes mellitus: Secondary | ICD-10-CM

## 2023-08-12 HISTORY — PX: ROBOTIC ASSITED PARTIAL NEPHRECTOMY: SHX6087

## 2023-08-12 LAB — HEMOGLOBIN AND HEMATOCRIT, BLOOD
HCT: 33.6 % — ABNORMAL LOW (ref 36.0–46.0)
Hemoglobin: 10.6 g/dL — ABNORMAL LOW (ref 12.0–15.0)

## 2023-08-12 LAB — POCT I-STAT, CHEM 8
BUN: 8 mg/dL (ref 8–23)
Calcium, Ion: 1.12 mmol/L — ABNORMAL LOW (ref 1.15–1.40)
Chloride: 109 mmol/L (ref 98–111)
Creatinine, Ser: 0.7 mg/dL (ref 0.44–1.00)
Glucose, Bld: 131 mg/dL — ABNORMAL HIGH (ref 70–99)
HCT: 29 % — ABNORMAL LOW (ref 36.0–46.0)
Hemoglobin: 9.9 g/dL — ABNORMAL LOW (ref 12.0–15.0)
Potassium: 3.4 mmol/L — ABNORMAL LOW (ref 3.5–5.1)
Sodium: 143 mmol/L (ref 135–145)
TCO2: 24 mmol/L (ref 22–32)

## 2023-08-12 LAB — TYPE AND SCREEN
ABO/RH(D): A POS
Antibody Screen: NEGATIVE

## 2023-08-12 SURGERY — NEPHRECTOMY, PARTIAL, ROBOT-ASSISTED
Anesthesia: General | Site: Abdomen | Laterality: Right

## 2023-08-12 MED ORDER — LIDOCAINE HCL (CARDIAC) PF 100 MG/5ML IV SOSY
PREFILLED_SYRINGE | INTRAVENOUS | Status: DC | PRN
  Administered 2023-08-12: 60 mg via INTRAVENOUS

## 2023-08-12 MED ORDER — SUGAMMADEX SODIUM 200 MG/2ML IV SOLN
INTRAVENOUS | Status: DC | PRN
  Administered 2023-08-12: 200 mg via INTRAVENOUS

## 2023-08-12 MED ORDER — FENTANYL CITRATE (PF) 100 MCG/2ML IJ SOLN
INTRAMUSCULAR | Status: AC
  Filled 2023-08-12: qty 2

## 2023-08-12 MED ORDER — HYOSCYAMINE SULFATE 0.125 MG SL SUBL
0.1250 mg | SUBLINGUAL_TABLET | SUBLINGUAL | Status: DC | PRN
  Administered 2023-08-13 – 2023-08-14 (×2): 0.125 mg via SUBLINGUAL
  Filled 2023-08-12 (×2): qty 1

## 2023-08-12 MED ORDER — SCOPOLAMINE 1 MG/3DAYS TD PT72SCOPOLAMINE 1 MG/3DAYS
1.0000 | MEDICATED_PATCH | TRANSDERMAL | Status: DC
Start: 2023-08-12 — End: 2023-08-12
  Administered 2023-08-12: 1.5 mg via TRANSDERMAL
  Filled 2023-08-12: qty 1

## 2023-08-12 MED ORDER — FIBRIN SEALANT 2 ML SINGLE DOSE KIT
2.0000 mL | PACK | Freq: Once | CUTANEOUS | Status: DC
  Filled 2023-08-12: qty 2

## 2023-08-12 MED ORDER — PANTOPRAZOLE SODIUM 40 MG PO TBEC
40.0000 mg | DELAYED_RELEASE_TABLET | Freq: Every day | ORAL | Status: DC
  Administered 2023-08-12 – 2023-08-14 (×3): 40 mg via ORAL
  Filled 2023-08-12 (×3): qty 1

## 2023-08-12 MED ORDER — DOCUSATE SODIUM 100 MG PO CAPS: 100.0000 mg | ORAL_CAPSULE | Freq: Two times a day (BID) | ORAL | Status: AC

## 2023-08-12 MED ORDER — ONDANSETRON HCL 4 MG/2ML IJ SOLN
INTRAMUSCULAR | Status: DC | PRN
  Administered 2023-08-12: 4 mg via INTRAVENOUS

## 2023-08-12 MED ORDER — VISTASEAL 4 ML SINGLE DOSE KIT
PACK | CUTANEOUS | Status: DC | PRN
  Administered 2023-08-12: 4 mL via TOPICAL

## 2023-08-12 MED ORDER — SODIUM CHLORIDE (PF) 0.9 % IJ SOLN
INTRAMUSCULAR | Status: DC | PRN
  Administered 2023-08-12: 20 mL via INTRAVENOUS

## 2023-08-12 MED ORDER — LACTATED RINGERS IV SOLN: INTRAVENOUS | Status: DC

## 2023-08-12 MED ORDER — SODIUM CHLORIDE 0.9 % IV SOLN: INTRAVENOUS | Status: DC | PRN

## 2023-08-12 MED ORDER — PROPOFOL 1000 MG/100ML IV EMUL
INTRAVENOUS | Status: AC
Start: 2023-08-12 — End: ?
  Filled 2023-08-12: qty 100

## 2023-08-12 MED ORDER — OXYCODONE HCL 5 MG/5ML PO SOLN: 5.0000 mg | Freq: Once | ORAL | Status: AC | PRN

## 2023-08-12 MED ORDER — DROPERIDOL 2.5 MG/ML IJ SOLN: 0.6250 mg | Freq: Once | INTRAMUSCULAR | Status: DC | PRN

## 2023-08-12 MED ORDER — CHLORHEXIDINE GLUCONATE 0.12 % MT SOLN: 15.0000 mL | Freq: Once | OROMUCOSAL | Status: DC

## 2023-08-12 MED ORDER — HYDROMORPHONE HCL 1 MG/ML IJ SOLN
0.5000 mg | INTRAMUSCULAR | Status: DC | PRN
  Administered 2023-08-13: 1 mg via INTRAVENOUS
  Administered 2023-08-13 (×2): 0.5 mg via INTRAVENOUS
  Administered 2023-08-13 – 2023-08-15 (×5): 1 mg via INTRAVENOUS
  Filled 2023-08-12 (×8): qty 1

## 2023-08-12 MED ORDER — DIPHENHYDRAMINE HCL 12.5 MG/5ML PO ELIX
12.5000 mg | ORAL_SOLUTION | Freq: Four times a day (QID) | ORAL | Status: DC | PRN
Start: 2023-08-12 — End: 2023-08-15
  Administered 2023-08-13: 12.5 mg via ORAL
  Filled 2023-08-12: qty 5

## 2023-08-12 MED ORDER — ALBUMIN HUMAN 5 % IV SOLN
INTRAVENOUS | Status: AC
  Filled 2023-08-12: qty 250

## 2023-08-12 MED ORDER — OXYCODONE-ACETAMINOPHEN 10-325 MG PO TABS: 1.0000 | ORAL_TABLET | Freq: Three times a day (TID) | ORAL | 0 refills | Status: AC | PRN

## 2023-08-12 MED ORDER — OXYCODONE HCL 5 MG PO TABS
5.0000 mg | ORAL_TABLET | Freq: Once | ORAL | Status: AC | PRN
Start: 2023-08-12 — End: 2023-08-12
  Administered 2023-08-12: 5 mg via ORAL

## 2023-08-12 MED ORDER — BUPIVACAINE LIPOSOME 1.3 % IJ SUSP
INTRAMUSCULAR | Status: AC
  Filled 2023-08-12: qty 20

## 2023-08-12 MED ORDER — FENTANYL CITRATE (PF) 100 MCG/2ML IJ SOLN
INTRAMUSCULAR | Status: DC | PRN
  Administered 2023-08-12 (×2): 50 ug via INTRAVENOUS

## 2023-08-12 MED ORDER — CLINDAMYCIN PHOSPHATE 900 MG/50ML IV SOLN
900.0000 mg | INTRAVENOUS | Status: AC
  Administered 2023-08-12: 900 mg via INTRAVENOUS
  Filled 2023-08-12: qty 50

## 2023-08-12 MED ORDER — VISTASEAL 4 ML SINGLE DOSE KIT
4.0000 mL | PACK | Freq: Once | CUTANEOUS | Status: DC
  Filled 2023-08-12: qty 4

## 2023-08-12 MED ORDER — SODIUM CHLORIDE 0.45 % IV SOLN
INTRAVENOUS | Status: AC
Start: 2023-08-12 — End: 2023-08-13

## 2023-08-12 MED ORDER — SODIUM CHLORIDE (PF) 0.9 % IJ SOLN
INTRAMUSCULAR | Status: AC
  Filled 2023-08-12: qty 20

## 2023-08-12 MED ORDER — CYCLOBENZAPRINE HCL 10 MG PO TABS
10.0000 mg | ORAL_TABLET | Freq: Three times a day (TID) | ORAL | Status: DC | PRN
  Administered 2023-08-13 – 2023-08-14 (×3): 10 mg via ORAL
  Filled 2023-08-12 (×3): qty 1

## 2023-08-12 MED ORDER — ORAL CARE MOUTH RINSE
15.0000 mL | Freq: Once | OROMUCOSAL | Status: AC
  Administered 2023-08-12: 15 mL via OROMUCOSAL

## 2023-08-12 MED ORDER — PROPOFOL 1000 MG/100ML IV EMUL
INTRAVENOUS | Status: AC
  Filled 2023-08-12: qty 100

## 2023-08-12 MED ORDER — MIDAZOLAM HCL 2 MG/2ML IJ SOLN
INTRAMUSCULAR | Status: DC | PRN
  Administered 2023-08-12: 2 mg via INTRAVENOUS

## 2023-08-12 MED ORDER — DOCUSATE SODIUM 100 MG PO CAPS
100.0000 mg | ORAL_CAPSULE | Freq: Two times a day (BID) | ORAL | Status: DC
Start: 2023-08-12 — End: 2023-08-15
  Administered 2023-08-12 – 2023-08-14 (×5): 100 mg via ORAL
  Filled 2023-08-12 (×5): qty 1

## 2023-08-12 MED ORDER — PHENYLEPHRINE HCL-NACL 20-0.9 MG/250ML-% IV SOLN
INTRAVENOUS | Status: DC | PRN
  Administered 2023-08-12: 20 ug/min via INTRAVENOUS

## 2023-08-12 MED ORDER — DIPHENHYDRAMINE HCL 50 MG/ML IJ SOLN
25.0000 mg | Freq: Once | INTRAMUSCULAR | Status: AC
  Administered 2023-08-12: 25 mg via INTRAVENOUS
  Filled 2023-08-12: qty 1

## 2023-08-12 MED ORDER — ONDANSETRON HCL 4 MG/2ML IJ SOLN
4.0000 mg | Freq: Once | INTRAMUSCULAR | Status: DC | PRN
Start: 2023-08-12 — End: 2023-08-12

## 2023-08-12 MED ORDER — ALBUTEROL SULFATE HFA 108 (90 BASE) MCG/ACT IN AERS
INHALATION_SPRAY | RESPIRATORY_TRACT | Status: AC
  Filled 2023-08-12: qty 6.7

## 2023-08-12 MED ORDER — BUPIVACAINE LIPOSOME 1.3 % IJ SUSP
INTRAMUSCULAR | Status: DC | PRN
  Administered 2023-08-12: 20 mL

## 2023-08-12 MED ORDER — ALBUMIN HUMAN 5 % IV SOLN: INTRAVENOUS | Status: DC | PRN

## 2023-08-12 MED ORDER — OXYCODONE HCL 5 MG PO TABS
10.0000 mg | ORAL_TABLET | Freq: Three times a day (TID) | ORAL | Status: DC | PRN
  Administered 2023-08-12 – 2023-08-14 (×4): 10 mg via ORAL
  Filled 2023-08-12 (×4): qty 2

## 2023-08-12 MED ORDER — OXYCODONE HCL 5 MG PO TABS
ORAL_TABLET | ORAL | Status: AC
  Filled 2023-08-12: qty 1

## 2023-08-12 MED ORDER — HYDROMORPHONE HCL 1 MG/ML IJ SOLN
INTRAMUSCULAR | Status: AC
  Administered 2023-08-12: 0.5 mg via INTRAVENOUS
  Filled 2023-08-12: qty 1

## 2023-08-12 MED ORDER — ROCURONIUM BROMIDE 10 MG/ML (PF) SYRINGE
PREFILLED_SYRINGE | INTRAVENOUS | Status: AC
  Filled 2023-08-12: qty 10

## 2023-08-12 MED ORDER — DIPHENHYDRAMINE HCL 50 MG/ML IJ SOLN
12.5000 mg | Freq: Four times a day (QID) | INTRAMUSCULAR | Status: DC | PRN
Start: 2023-08-12 — End: 2023-08-15

## 2023-08-12 MED ORDER — ALBUTEROL SULFATE (2.5 MG/3ML) 0.083% IN NEBU: 3.0000 mL | INHALATION_SOLUTION | Freq: Four times a day (QID) | RESPIRATORY_TRACT | Status: DC | PRN

## 2023-08-12 MED ORDER — MIDAZOLAM HCL 2 MG/2ML IJ SOLN
INTRAMUSCULAR | Status: AC
  Filled 2023-08-12: qty 2

## 2023-08-12 MED ORDER — HYDROMORPHONE HCL 2 MG/ML IJ SOLN
INTRAMUSCULAR | Status: AC
  Filled 2023-08-12: qty 1

## 2023-08-12 MED ORDER — HYDROMORPHONE HCL 1 MG/ML IJ SOLN
INTRAMUSCULAR | Status: DC | PRN
  Administered 2023-08-12: .4 mg via INTRAVENOUS
  Administered 2023-08-12: .6 mg via INTRAVENOUS

## 2023-08-12 MED ORDER — ONDANSETRON HCL 4 MG/2ML IJ SOLN: 4.0000 mg | INTRAMUSCULAR | Status: DC | PRN

## 2023-08-12 MED ORDER — HYDROMORPHONE HCL 1 MG/ML IJ SOLN
0.2500 mg | INTRAMUSCULAR | Status: DC | PRN
  Administered 2023-08-12 (×2): 0.5 mg via INTRAVENOUS

## 2023-08-12 MED ORDER — ATORVASTATIN CALCIUM 20 MG PO TABS
20.0000 mg | ORAL_TABLET | Freq: Every day | ORAL | Status: DC
  Administered 2023-08-12 – 2023-08-14 (×3): 20 mg via ORAL
  Filled 2023-08-12 (×3): qty 1

## 2023-08-12 MED ORDER — MICROFIBRILLAR COLL HEMOSTAT EX PADS
MEDICATED_PAD | CUTANEOUS | Status: DC | PRN
  Administered 2023-08-12: 1 via TOPICAL

## 2023-08-12 MED ORDER — ROCURONIUM BROMIDE 100 MG/10ML IV SOLN
INTRAVENOUS | Status: DC | PRN
  Administered 2023-08-12: 30 mg via INTRAVENOUS
  Administered 2023-08-12: 70 mg via INTRAVENOUS
  Administered 2023-08-12: 30 mg via INTRAVENOUS

## 2023-08-12 MED ORDER — PROPOFOL 10 MG/ML IV BOLUS
INTRAVENOUS | Status: DC | PRN
  Administered 2023-08-12: 160 ug/kg/min via INTRAVENOUS
  Administered 2023-08-12: 140 mg via INTRAVENOUS

## 2023-08-12 MED ORDER — DEXAMETHASONE SODIUM PHOSPHATE 4 MG/ML IJ SOLN
INTRAMUSCULAR | Status: DC | PRN
  Administered 2023-08-12: 8 mg via INTRAVENOUS

## 2023-08-12 MED ORDER — ACETAMINOPHEN 10 MG/ML IV SOLN
1000.0000 mg | Freq: Four times a day (QID) | INTRAVENOUS | Status: AC
  Administered 2023-08-12 – 2023-08-13 (×4): 1000 mg via INTRAVENOUS
  Filled 2023-08-12 (×4): qty 100

## 2023-08-12 SURGICAL SUPPLY — 74 items
APPLICATOR SURGIFLO ENDO (HEMOSTASIS) IMPLANT
APPLICATOR VISTASEAL 35 (MISCELLANEOUS) ×1 IMPLANT
BAG COUNTER SPONGE SURGICOUNT (BAG) IMPLANT
BAG LAPAROSCOPIC 12 15 PORT 16 (BASKET) IMPLANT
BAG RETRIEVAL 12/15 (BASKET) ×1 IMPLANT
CAUTERY HOOK MNPLR 1.6 DVNC XI (INSTRUMENTS) ×1 IMPLANT
CHLORAPREP W/TINT 26 (MISCELLANEOUS) ×1 IMPLANT
CLIP LIGATING HEM O LOK PURPLE (MISCELLANEOUS) ×2 IMPLANT
CLIP LIGATING HEMO LOK XL GOLD (MISCELLANEOUS) IMPLANT
CLIP LIGATING HEMO O LOK GREEN (MISCELLANEOUS) IMPLANT
CLIP SUT LAPRA TY ABSORB (SUTURE) ×2 IMPLANT
COVER SURGICAL LIGHT HANDLE (MISCELLANEOUS) ×1 IMPLANT
COVER TIP SHEARS 8 DVNC (MISCELLANEOUS) ×1 IMPLANT
DERMABOND ADVANCED .7 DNX12 (GAUZE/BANDAGES/DRESSINGS) ×1 IMPLANT
DRAIN CHANNEL 15F RND FF 3/16 (WOUND CARE) IMPLANT
DRAPE ARM DVNC X/XI (DISPOSABLE) ×4 IMPLANT
DRAPE COLUMN DVNC XI (DISPOSABLE) ×1 IMPLANT
DRAPE INCISE IOBAN 66X45 STRL (DRAPES) ×1 IMPLANT
DRAPE SHEET LG 3/4 BI-LAMINATE (DRAPES) ×1 IMPLANT
DRIVER NDL LRG 8 DVNC XI (INSTRUMENTS) ×3 IMPLANT
DRIVER NDLE LRG 8 DVNC XI (INSTRUMENTS) ×3 IMPLANT
ELECT PENCIL ROCKER SW 15FT (MISCELLANEOUS) ×1 IMPLANT
ELECT REM PT RETURN 15FT ADLT (MISCELLANEOUS) ×1 IMPLANT
EVACUATOR SILICONE 100CC (DRAIN) IMPLANT
FORCEPS PROGRASP DVNC XI (FORCEP) ×2 IMPLANT
GAUZE 4X4 16PLY ~~LOC~~+RFID DBL (SPONGE) IMPLANT
GLOVE BIO SURGEON STRL SZ 6.5 (GLOVE) ×1 IMPLANT
GLOVE BIOGEL PI IND STRL 8 (GLOVE) ×1 IMPLANT
GLOVE SURG LX STRL 7.5 STRW (GLOVE) ×2 IMPLANT
GOWN STRL REUS W/ TWL XL LVL3 (GOWN DISPOSABLE) ×2 IMPLANT
GOWN STRL SURGICAL XL XLNG (GOWN DISPOSABLE) ×1 IMPLANT
HEMOSTAT SURGICEL 4X8 (HEMOSTASIS) ×1 IMPLANT
HOLDER FOLEY CATH W/STRAP (MISCELLANEOUS) ×1 IMPLANT
IRRIG SUCT STRYKERFLOW 2 WTIP (MISCELLANEOUS) ×1 IMPLANT
IRRIGATION SUCT STRKRFLW 2 WTP (MISCELLANEOUS) ×1 IMPLANT
KIT BASIN OR (CUSTOM PROCEDURE TRAY) ×1 IMPLANT
KIT TURNOVER KIT A (KITS) IMPLANT
LOOP VESSEL MAXI BLUE (MISCELLANEOUS) IMPLANT
MARKER SKIN DUAL TIP RULER LAB (MISCELLANEOUS) ×1 IMPLANT
NDL INSUFFLATION 14GA 120MM (NEEDLE) ×1 IMPLANT
NEEDLE INSUFFLATION 14GA 120MM (NEEDLE) ×1 IMPLANT
PROTECTOR NERVE ULNAR (MISCELLANEOUS) ×2 IMPLANT
RELOAD STAPLE 45 2.6 WHT THIN (STAPLE) IMPLANT
SCISSORS LAP 5X45 EPIX DISP (ENDOMECHANICALS) ×1 IMPLANT
SCISSORS MNPLR CVD DVNC XI (INSTRUMENTS) ×1 IMPLANT
SEAL UNIV 5-12 XI (MISCELLANEOUS) ×4 IMPLANT
SET TUBE SMOKE EVAC HIGH FLOW (TUBING) ×1 IMPLANT
SOL ELECTROSURG ANTI STICK (MISCELLANEOUS) ×1 IMPLANT
SOLUTION ELECTROSURG ANTI STCK (MISCELLANEOUS) ×1 IMPLANT
SPIKE FLUID TRANSFER (MISCELLANEOUS) ×1 IMPLANT
STAPLE RELOAD 45 WHT (STAPLE) ×3 IMPLANT
STAPLER POWER ECHELON 45 WIDE (STAPLE) IMPLANT
SURGIFLO W/THROMBIN 8M KIT (HEMOSTASIS) IMPLANT
SUT ETHILON 2 0 PS N (SUTURE) IMPLANT
SUT MNCRL AB 4-0 PS2 18 (SUTURE) ×2 IMPLANT
SUT PDS AB 0 CT1 36 (SUTURE) IMPLANT
SUT V-LOC BARB 180 2/0GR6 GS22 (SUTURE) IMPLANT
SUT VIC AB 0 CT1 27XBRD ANTBC (SUTURE) IMPLANT
SUT VIC AB 1 CT1 36 (SUTURE) ×4 IMPLANT
SUT VIC AB 2-0 SH 27X BRD (SUTURE) IMPLANT
SUT VIC AB 4-0 SH 27XBRD (SUTURE) IMPLANT
SUT VICRYL 0 UR6 27IN ABS (SUTURE) IMPLANT
SUT VLOC 3-0 9IN GRN (SUTURE) ×1 IMPLANT
SUT VLOC BARB 180 ABS3/0GR12 (SUTURE) ×3 IMPLANT
SUTURE V-LC BRB 180 2/0GR6GS22 (SUTURE) IMPLANT
SUTURE VLOC BRB 180 ABS3/0GR12 (SUTURE) ×1 IMPLANT
SYS BAG RETRIEVAL 10MM (BASKET) ×1 IMPLANT
SYSTEM BAG RETRIEVAL 10MM (BASKET) ×1 IMPLANT
TOWEL OR 17X26 10 PK STRL BLUE (TOWEL DISPOSABLE) ×1 IMPLANT
TRAY FOLEY MTR SLVR 16FR STAT (SET/KITS/TRAYS/PACK) ×1 IMPLANT
TRAY LAPAROSCOPIC (CUSTOM PROCEDURE TRAY) ×1 IMPLANT
TROCAR Z THREAD OPTICAL 12X100 (TROCAR) ×1 IMPLANT
TROCAR Z-THREAD OPTICAL 5X100M (TROCAR) IMPLANT
WATER STERILE IRR 1000ML POUR (IV SOLUTION) ×1 IMPLANT

## 2023-08-12 NOTE — Anesthesia Postprocedure Evaluation (Signed)
Anesthesia Post Note  Patient: [name]  Procedure(s) Performed: XI ROBOTIC ASSITED RIGHT PARTIAL NEPHRECTOMY (Right: Abdomen)     Patient location during evaluation: PACU Anesthesia Type: General Level of consciousness: awake and alert and oriented Pain management: pain level controlled Vital Signs Assessment: post-procedure vital signs reviewed and stable Respiratory status: spontaneous breathing, nonlabored ventilation and respiratory function stable Cardiovascular status: blood pressure returned to baseline and stable Postop Assessment: no apparent nausea or vomiting Anesthetic complications: no  No notable events documented.                Deondrick Searls A.

## 2023-08-12 NOTE — Anesthesia Procedure Notes (Signed)
Procedure Name: Intubation Date/Time: 08/12/2023 1:23 PM  Performed by: Floydene Flock, CRNAPre-anesthesia Checklist: Patient identified, Emergency Drugs available, Suction available, Patient being monitored and Timeout performed Patient Re-evaluated:Patient Re-evaluated prior to induction Oxygen Delivery Method: Circle system utilized Preoxygenation: Pre-oxygenation with 100% oxygen Induction Type: IV induction Ventilation: Mask ventilation without difficulty Laryngoscope Size: Mac and 3 Grade View: Grade I Tube type: Oral Tube size: 7.5 mm Airway Equipment and Method: Stylet Placement Confirmation: ETT inserted through vocal cords under direct vision, positive ETCO2 and breath sounds checked- equal and bilateral Secured at: 22 cm Tube secured with: Tape Dental Injury: Teeth and Oropharynx as per pre-operative assessment

## 2023-08-12 NOTE — Op Note (Signed)
Operative Note  Preoperative diagnosis:  1.  3.8 cm right renal mass  Postoperative diagnosis: 1.  3.8 cm right renal mass  Procedure(s): 1.  Robot-assisted laparoscopic right partial nephrectomy 2.  Intraoperative ultrasound of single retroperitoneal organ  Surgeon: Rhoderick Moody, MD  Assistants: 1.  Harrie Foreman, PA-C  An assistant was required for this surgical procedure.  The duties of the assistant included but were not limited to suctioning, passing suture, camera manipulation, retraction.  This procedure would not be able to be performed without an Geophysicist/field seismologist.   Anesthesia:  General  Complications: There was a small rent in the posterior aspects of the right renal vein measuring approximately 2 to 3 mm that required figure-of-eight closure with a 4-0 Prolene suture that was hemostatic following closure and at the conclusion of the case.  EBL: 800 mL  Specimens: 1.  Right renal mass 2.  Right renal cyst wall  Drains/Catheters: 1.  Right lower quadrant JP drain  Intraoperative findings:   Grossly negative margins following excision of right renal mass The right renorrhaphy was hemostatic at the conclusion of the case The right renal hilum, specifically the rent in the right renal vein, was hemostatic at the conclusion of the case  Indication:  [name] is a 68 y.o. female with a solid enhancing 3.8 cm right renal mass with features concerning for renal cell carcinoma.  She has been consented for the above procedures, voices understanding and wishes to proceed.  Description of procedure:  After informed consent was signed, the patient was taken back to the operating room and properly anesthetized.  The patient was then placed in the left lateral decubitus position with all pressure points padded.  The abdomen was then prepped and draped in the usual sterile fashion.  A time-out was then performed.    An 8 mm incision was then made lateral to the right rectus muscle  at the level of the right 12th rib.  A Veress needle was then used to access the abdominal cavity.  A saline drop test showed no signs of obstruction and aspiration of the Veress needle revealed no blood or sucus.  The abdominal cavity was then insufflated to 15 mmHg.  An 8 mm robotic trocar was then atraumatically inserted into the abdominal cavity.  The robotic camera was then inserted through the port and inspection of the abdominal cavity revealed no evidence of adjacent organ or vessel injury.  We then placed three additional 8 mm robotic ports and a 12 mm assistant port in such as fashion as to triangulate the right renal hilum.  The robot was then docked into postion.   Using a combination of blunt and cold scissors dissection, the hepatic attachments were released from the abdominal sidewall.  The white line of Toldt along the ascending colon was then incised, allowing [place] to reflect the colon medially and expose the anterior surface of the right kidney.  The duodenum was then Kocherized medially, which abruptly led [place] to identification of the inferior vena cava.    Once the colon was adequately mobilized, we moved to the lower pole and identified the gonadal vein and ureter.  The gonadal vein was then left running parallel to the vena cava and the right ureter was reflected anteriorly.  Using cautious cautery, the overlying perihilar attachments were then released.  This yielded visualization of the renal hilum, which included a single right renal vein and two right renal arteries.  There were branches of the renal  vein that were found to be wrapping around the lower branch of the renal artery.  While dissecting the lower branch of the right renal artery, small rent was created in the posterior aspects of the right renal vein, measuring 2 to 3 mm, but resulted in significant blood loss.  The perilymphatic tissue surrounding the right renal arteries were carefully released so that the right renal arteries  were fully encircled.  Bulldog clamps were then placed on both branches of the renal arteries for approximately 11 minutes to allow closure of the opening and the renal vein.  The rent in the renal vein was  closed with a 4-0 Prolene in a figure-of-eight fashion, which achieved excellent hemostasis.      We next turned our attention to defatting the kidney.  An anterior incision along Gerota's fascia was created and the kidney was fully mobilized.   The renal mass is identified at the upper pole.  Using intraoperative ultrasound, the tumor was then carefully evaluated and demonstrated heterogenous echogenicity compared to the rest of the renal parenchyma. The resection margin was marked to allow for wide excision of the renal mass.  The renal hilum was once again identified and a bulldog clamps were placed.  Using cold scissors, the right renal mass was then carefully excised, leaving a grossly negative margin.  Excision of the mass appeared complete with no tumor grossly remaining.  The tumor was then placed in the right lower quadrant, to be retrieved following repair of the renal defect.  A series of running 3-0 V lock suture was then used to reapproximate the resection bed.  Tension was placed with hemo-lock clips.   The right renal capsule was then reapproximated using a 0 Vicryl suture on a CT-1 needle in an interrupted fashion using hemo-lock clips as a buttress.  Lapra-Ty's were then placed on each of the interrupted sutures.  The bulldog was then released, which warm ischemia time totaled 15 minutes.   Once hemostasis was achieved and the renal bed was irrigated, Surgicel and Vistaseal was then placed over the renorrhaphy.  Gerota's fascia was then reapproximated with a running v-lok suture. The renal mass was retrieved and placed in an Endo Catch bag.  A right lower quadrant JP drain was placed and secured with a nylon suture at the level of the skin.  The mass was extracted through the 12 mm assistant  port.  The fascia of the assistant port was then reapproximated with a running 0 PDS suture.    The abdomen was desufflated with all ports removed.  The skin was then reapproximated using running Monocryl and dressed appropriately.  The patient was then replaced in the supine position and was awakened from anesthesia without complications.  Plan:  Monitor on the floor.  Bedrest overnight.

## 2023-08-12 NOTE — Discharge Instructions (Signed)
Activity:  You are encouraged to ambulate frequently (about every hour during waking hours) to help prevent blood clots from forming in your legs or lungs.  However, you should not engage in any heavy lifting (> 10-15 lbs), strenuous activity, or straining. Diet: You should advance your diet as instructed by your physician.  It will be normal to have some bloating, nausea, and abdominal discomfort intermittently. Prescriptions:  You will be provided a prescription for pain medication to take as needed.  If your pain is not severe enough to require the prescription pain medication, you may take extra strength Tylenol instead which will have less side effects.  You should also take a prescribed stool softener to avoid straining with bowel movements as the prescription pain medication may constipate you. Incisions: You may remove your dressing bandages 48 hours after surgery if not removed in the hospital.  You will either have some small staples or special tissue glue at each of the incision sites. Once the bandages are removed (if present), the incisions may stay open to air.  You may start showering (but not soaking or bathing in water) the 2nd day after surgery and the incisions simply need to be patted dry after the shower.  No additional care is needed. What to call us about: You should call the office [phone]) if you develop fever > 101 or develop persistent vomiting.   You may resume aspirin, advil, aleve, vitamins, and supplements 7 days after surgery.

## 2023-08-12 NOTE — Anesthesia Preprocedure Evaluation (Signed)
Anesthesia Evaluation  Patient identified by MRN, date of birth, ID band Patient awake    Reviewed: Allergy & Precautions, NPO status , Patient's Chart, lab work & pertinent test results  History of Anesthesia Complications (+) PONV and history of anesthetic complications  Airway Mallampati: II  TM Distance: >3 FB     Dental no notable dental hx. (+) Teeth Intact, Dental Advisory Given   Pulmonary asthma , pneumonia, resolved, former smoker   Pulmonary exam normal breath sounds clear to auscultation       Cardiovascular negative cardio ROS Normal cardiovascular exam Rhythm:Regular Rate:Normal     Neuro/Psych negative neurological ROS  negative psych ROS   GI/Hepatic Neg liver ROS, hiatal hernia,GERD  Medicated,,Hx/o biliary dyskinesia   Endo/Other  Hyperlipidemia  Renal/GU Renal diseaseRight renal mass Hx/o renal calculi  negative genitourinary   Musculoskeletal  (+) Arthritis , Osteoarthritis,    Abdominal   Peds  Hematology negative hematology ROS (+)   Anesthesia Other Findings   Reproductive/Obstetrics                              Anesthesia Physical Anesthesia Plan  ASA: 2  Anesthesia Plan: General   Post-op Pain Management: Dilaudid IV, Precedex, Ofirmev IV (intra-op)* and Ketamine IV*   Induction: Intravenous and Cricoid pressure planned  PONV Risk Score and Plan: 4 or greater and Treatment may vary due to age or medical condition, Scopolamine patch - Pre-op, Diphenhydramine, TIVA, Midazolam, Ondansetron and Dexamethasone  Airway Management Planned: Oral ETT  Additional Equipment: None  Intra-op Plan:   Post-operative Plan: Extubation in OR  Informed Consent: I have reviewed the patients History and Physical, chart, labs and discussed the procedure including the risks, benefits and alternatives for the proposed anesthesia with the patient or authorized  representative who has indicated his/her understanding and acceptance.     Dental advisory given  Plan Discussed with: CRNA and Anesthesiologist  Anesthesia Plan Comments:          Anesthesia Quick Evaluation

## 2023-08-12 NOTE — H&P (Signed)
Office Visit Report     08/09/2023   --------------------------------------------------------------------------------   [name]  MRN: [id number]  DOB: 06-11-1956, 68 year old Female  PRIMARY CARE:    REFERRING:  Dawn Samson A. Liliane Shi, MD  PROVIDER:  Rhoderick Moody, M.D.  TREATING:  Ulyses Amor, [place]  LOCATION:  [facility] [phone]     --------------------------------------------------------------------------------   CC/HPI: Pt presents today for pre-operative history and physical exam in anticipation of robotic assisted lap right partial nephrectomy by Dr. Liliane Keller on 08/12/23. She is doing well and is without complaint.   Pain management is Ephriam Knuckles. She gave permission for our regulation of pain meds for procedure and then pain mgmt returns to her.   Pt denies F/C, HA, CP, SOB, N/V, diarrhea/constipation, back pain, flank pain, hematuria, and dysuria.    HX:   Renal mass   [name] is a 68 year old female with a solid and enhancing renal mass measuring 3.8 cm involving the RIGHT kidney. The mass was discovered via CT this fall during an evaluation for right flank pain and further characterized via MRI at [facility].   -Anatomy: Exophytic 3.8 cm right mid-pole mass w/ single artery and vein. Normal left kidney with no stone burden  -Personal/family history of GU malignancies: denies  -Smoking history: smoked causally during high school and college--quit 44 years ago  -Prior abdominal surgeries: Lap cholecystectomy in 2014  -Labs: (03/2023): Cr-0.8, eGFR> 60  -History of kidney stones: s/p right ureteroscopy in 04/2023 with Dr. Hurshel Party not tolerate stent well--s/p right stent removal on 05/23/23  -She is voiding w/o difficulty and denies residual flank pain, dysuria or hematuria.     ALLERGIES: Erythromycin Base TBEC - Hives Morphine Derivatives - Anaphylaxis Nitrofurantoin CAPS - "just felt bad" Penicillins - Anaphylaxis, hypotension,  facial edema     Notes: latex skin blisters    MEDICATIONS: Flexeril  Magnesium  Oxycodone-Acetaminophen 10 mg-325 mg tablet  Protonix 20 mg tablet, delayed release Oral  Rosuvastatin Calcium 20 mg tablet  Turmeric     GU PSH: Cystoscopy Ureteroscopy - 2008 ESWL - 2008 Hysterectomy Unilat SO - 2013, 2008       PSH Notes: Colonoscopy (Fiberoptic), Cholecystectomy, Saphenous Vein removal due to superficial DVT, Hysterectomy, Hemorrhoidectomy, Cystoscopy With Ureteroscopy, Lithotripsy, Hysterectomy   right knee replacement with subsequent tendon rupture and repair    NON-GU PSH: Cholecystectomy (open) - 2013 Diagnostic Colonoscopy - 2015 Hemorrhoidectomy (favorite) - 2008     GU PMH: Right renal neoplasm - 06/13/2023, - 06/01/2023 Renal calculus (Stable) - 06/01/2023, Kidney stone on left side, - 2015, Kidney stone on right side, - 2014 Abdominal Pain Unspec, Left flank pain - 2015 Ureteral calculus, Calculus of left ureter - 2015, Ureteral Stone, - 2014 Other microscopic hematuria, Microscopic hematuria - 2015 LLQ pain, Abdominal Pain In The Left Lower Belly (LLQ) - 2014      PMH Notes:  2011-08-18 13:43:33 - Note: Spondylolisthesis  2011-08-18 13:37:52 - Note: Venous Thrombosis Of The Deep Vessels Of The Distal Lower Extremity  2011-08-18 13:37:52 - Note: Arthritis   NON-GU PMH: Nausea, Nausea - 2015 Personal history of colonic polyps, History of colonic polyps - 2015 Encounter for general adult medical examination without abnormal findings, Encounter for preventive health examination - 2015 Asthma, Asthma - 2014, Asthma, - 2014 Congenital multiple renal cysts, Congenital multiple renal cysts - 2014 Personal history of other diseases of the digestive system, History of esophageal reflux - 2014 Personal history of other diseases  of the musculoskeletal system and connective tissue, History of low back pain - 2014 Arthritis GERD Hypercholesterolemia    FAMILY HISTORY:  2 daughters - Daughter 1 son - Son Cholelithiasis - Mother Chronic Liver Disease - Father Family Health Status Number - Runs In Family Kidney Cancer - Runs In Family Lung Cancer - Sister Mother Deceased At Age 53 from diabetic complicati - Runs In Family rectal cancer - Mother Urologic Disorder - Runs In Family   SOCIAL HISTORY: Marital Status: Divorced Preferred Language: English; Ethnicity: Not Hispanic Or Latino; Race: White Current Smoking Status: Patient does not smoke anymore.   Tobacco Use Assessment Completed: Used Tobacco in last 30 days? Does not use smokeless tobacco. Does drink.  Does not use drugs. Drinks 2 caffeinated drinks per day. Has not had a blood transfusion. 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     Notes: Former smoker, Marital History - Divorced  ETOH occasionally red wine or prosecco    REVIEW OF SYSTEMS:    GU Review Female:   Patient denies frequent urination, hard to postpone urination, burning /pain with urination, get up at night to urinate, leakage of urine, stream starts and stops, trouble starting your stream, have to strain to urinate, and being pregnant.  Gastrointestinal (Upper):   Patient denies nausea, vomiting, and indigestion/ heartburn.  Gastrointestinal (Lower):   Patient reports constipation. Patient denies diarrhea.  Constitutional:   Patient denies fever, night sweats, weight loss, and fatigue.  Skin:   Patient denies skin rash/ lesion and itching.  Eyes:   Patient denies double vision and blurred vision.  Ears/ Nose/ Throat:   Patient denies sore throat and sinus problems.  Hematologic/Lymphatic:   Patient denies swollen glands and easy bruising.  Cardiovascular:   Patient denies leg swelling and chest pains.  Respiratory:   Patient denies cough and shortness of breath.  Endocrine:   Patient denies excessive thirst.  Musculoskeletal:   Patient denies back pain and joint pain.  Neurological:   Patient denies headaches and  dizziness.  Psychologic:   Patient denies depression and anxiety.   Notes: chronic due to narcotics     VITAL SIGNS:      08/09/2023 02:52 PM  BP 143/87 mmHg  Pulse 82 /min  Temperature 97.7 F / 36.5 C   MULTI-SYSTEM PHYSICAL EXAMINATION:    Constitutional: Well-nourished. No physical deformities. Normally developed. Good grooming.  Neck: Neck symmetrical, not swollen. Normal tracheal position.  Respiratory: Normal breath sounds. No labored breathing, no use of accessory muscles.   Cardiovascular: Regular rate and rhythm. No murmur, no gallop.  Lymphatic: No enlargement of neck, axillae, groin.  Skin: No paleness, no jaundice, no cyanosis. No lesion, no ulcer, no rash.  Neurologic / Psychiatric: Oriented to time, oriented to place, oriented to person. No depression, no anxiety, no agitation.  Gastrointestinal: No mass, no tenderness, no rigidity, obese abdomen.   Eyes: Normal conjunctivae. Normal eyelids.  Ears, Nose, Mouth, and Throat: Left ear no scars, no lesions, no masses. Right ear no scars, no lesions, no masses. Nose no scars, no lesions, no masses. Normal hearing. Normal lips.  Musculoskeletal: Normal gait and station of head and neck.     Complexity of Data:  Records Review:   Previous Patient Records  Urine Test Review:   Urinalysis   08/09/23  Urinalysis  Urine Appearance Clear   Urine Color Yellow   Urine Glucose Neg mg/dL  Urine Bilirubin Neg mg/dL  Urine Ketones Neg mg/dL  Urine Specific Gravity 1.020  Urine Blood Neg ery/uL  Urine pH 6.0   Urine Protein Trace mg/dL  Urine Urobilinogen 0.2 mg/dL  Urine Nitrites Neg   Urine Leukocyte Esterase Neg leu/uL   PROCEDURES:          Urinalysis - 81003 Dipstick Dipstick Cont'd  Color: Yellow Bilirubin: Neg mg/dL  Appearance: Clear Ketones: Neg mg/dL  Specific Gravity: 5.784 Blood: Neg ery/uL  pH: 6.0 Protein: Trace mg/dL  Glucose: Neg mg/dL Urobilinogen: 0.2 mg/dL    Nitrites: Neg    Leukocyte Esterase:  Neg leu/uL    ASSESSMENT:      ICD-10 Details  1 GU:   Right renal neoplasm - D49.511    PLAN:            Medications Stop Meds: Neurontin 300 MG Oral Capsule Oral  Start: 02/10/2012  Discontinue: 08/09/2023  - Reason: The patient suffered unacceptable side effects.            Schedule Return Visit/Planned Activity: Keep Scheduled Appointment - Schedule Surgery          Document Letter(s):  Created for Patient: Clinical Summary         Notes:   There are no changes in the patients history or physical exam since last evaluation by Dr. Liliane Keller. Pt is scheduled to undergo RAL right partial nephrectomy on 08/12/23.   All pt's questions were answered to the best of my ability.    -I personally reviewed imaging results and films with the patient. We discussed that the mass in question has features concerning for malignancy. I explained the natural history of presumed renal cell carcinoma. I reviewed the AUA guidelines for evaluation and treatment of the small renal mass. The options of active surveillance, in situ tumor ablation, partial and radical nephrectomy was discussed. The risks of robot-assisted RIGHT partial nephrectomy were discussed in detail including but not limited to: negative pathology, open conversion, completion nephrectomy, infection of the urinary tract/skin/abdominal cavity, VTE, MI/CVA, lymphatic leak, injury to adjacent solid/hollow viscus organs, bleeding requiring a blood transfusion, catastrophic bleeding, hernia formation, need for postoperative angioembolization, urinary leak requiring stent/drain, and other imponderables.

## 2023-08-12 NOTE — Transfer of Care (Signed)
Immediate Anesthesia Transfer of Care Note  Patient: [name]  Procedure(s) Performed: XI ROBOTIC ASSITED RIGHT PARTIAL NEPHRECTOMY (Right: Abdomen)  Patient Location: PACU  Anesthesia Type:General  Level of Consciousness: awake, oriented, and drowsy  Airway & Oxygen Therapy: Patient Spontanous Breathing and Patient connected to face mask oxygen  Post-op Assessment: Report given to RN and Post -op Vital signs reviewed and stable  Post vital signs: Reviewed and stable  Last Vitals:  Vitals Value Taken Time  BP 128/76 08/12/23 1639  Temp    Pulse 79 08/12/23 1643  Resp 19 08/12/23 1643  SpO2 92 % 08/12/23 1643  Vitals shown include unfiled device data.  Last Pain:  Vitals:   08/12/23 1143  TempSrc:   PainSc: 3          Complications: No notable events documented.

## 2023-08-13 ENCOUNTER — Encounter (HOSPITAL_COMMUNITY): Payer: Self-pay | Admitting: Urology

## 2023-08-13 LAB — BASIC METABOLIC PANEL
Anion gap: 9 (ref 5–15)
BUN: 11 mg/dL (ref 8–23)
CO2: 24 mmol/L (ref 22–32)
Calcium: 8.4 mg/dL — ABNORMAL LOW (ref 8.9–10.3)
Chloride: 103 mmol/L (ref 98–111)
Creatinine, Ser: 0.93 mg/dL (ref 0.44–1.00)
GFR, Estimated: >60 mL/min (ref >60)
Glucose, Bld: 123 mg/dL — ABNORMAL HIGH (ref 70–99)
Potassium: 3.9 mmol/L (ref 3.5–5.1)
Sodium: 136 mmol/L (ref 135–145)

## 2023-08-13 LAB — HEMOGLOBIN AND HEMATOCRIT, BLOOD
HCT: 29.1 % — ABNORMAL LOW (ref 36.0–46.0)
Hemoglobin: 9.5 g/dL — ABNORMAL LOW (ref 12.0–15.0)

## 2023-08-13 NOTE — Plan of Care (Signed)

## 2023-08-13 NOTE — Plan of Care (Signed)
  Problem: Education: Goal: Knowledge of General Education information will improve Description: Including pain rating scale, medication(s)/side effects and non-pharmacologic comfort measures Outcome: Progressing   Problem: Health Behavior/Discharge Planning: Goal: Ability to manage health-related needs will improve Outcome: Progressing   Problem: Clinical Measurements: Goal: Ability to maintain clinical measurements within normal limits will improve Outcome: Progressing Goal: Will remain free from infection Outcome: Progressing Goal: Diagnostic test results will improve Outcome: Progressing Goal: Respiratory complications will improve Outcome: Progressing Goal: Cardiovascular complication will be avoided Outcome: Progressing   Problem: Activity: Goal: Risk for activity intolerance will decrease Outcome: Progressing   Problem: Nutrition: Goal: Adequate nutrition will be maintained Outcome: Progressing   Problem: Coping: Goal: Level of anxiety will decrease Outcome: Progressing   Problem: Elimination: Goal: Will not experience complications related to bowel motility Outcome: Progressing Goal: Will not experience complications related to urinary retention Outcome: Progressing   Problem: Pain Managment: Goal: General experience of comfort will improve and/or be controlled Outcome: Progressing   Problem: Safety: Goal: Ability to remain free from injury will improve Outcome: Progressing   Problem: Skin Integrity: Goal: Risk for impaired skin integrity will decrease Outcome: Progressing   Problem: Education: Goal: Knowledge of the prescribed therapeutic regimen will improve Outcome: Progressing   Problem: Bowel/Gastric: Goal: Gastrointestinal status for postoperative course will improve Outcome: Progressing   Problem: Clinical Measurements: Goal: Postoperative complications will be avoided or minimized Outcome: Progressing   Problem: Respiratory: Goal:  Ability to achieve and maintain a regular respiratory rate will improve Outcome: Progressing   Problem: Skin Integrity: Goal: Demonstration of wound healing without infection will improve Outcome: Progressing   Problem: Urinary Elimination: Goal: Ability to avoid or minimize complications of infection will improve Outcome: Progressing Goal: Ability to achieve and maintain urine output will improve Outcome: Progressing

## 2023-08-13 NOTE — Care Management Obs Status (Signed)
MEDICARE OBSERVATION STATUS NOTIFICATION   Patient Details  Name: [name] MRN: [id number] Date of Birth: 1955/10/11   Medicare Observation Status Notification Given:  Yes    Ewing Schlein, LCSW 08/13/2023, 3:29 PM

## 2023-08-13 NOTE — Progress Notes (Signed)
   08/13/23 1534  TOC Brief Assessment  Insurance and Status Reviewed  Patient has primary care physician Yes  Home environment has been reviewed Resides alone in an apartment  Prior level of function: Independent with ADLs at baseline  Prior/Current Home Services No current home services  Social Drivers of Health Review SDOH reviewed no interventions necessary  Readmission risk has been reviewed Yes  Transition of care needs no transition of care needs at this time

## 2023-08-13 NOTE — Progress Notes (Signed)
1 Day Post-Op Subjective: Patient reports mild abdominal pain. Negative flatus. Hemoglobin stable. Tolerating clears  Objective: Vital signs in last 24 hours: Temp:  [97.4 F (36.3 C)-98.9 F (37.2 C)] 98.3 F (36.8 C) (01/25 1259) Pulse Rate:  [69-86] 69 (01/25 1259) Resp:  [12-22] 20 (01/25 1259) BP: (99-133)/(62-78) 113/74 (01/25 1259) SpO2:  [90 %-99 %] 90 % (01/25 1259) Weight:  [80 kg] 80 kg (01/25 0300)  Intake/Output from previous day: 01/24 0701 - 01/25 0700 In: 4249.3 [I.V.:3549.3; IV Piggyback:700] Out: 3665 [Urine:2740; Drains:105; Blood:820] Intake/Output this shift: Total I/O In: 877.9 [I.V.:719; IV Piggyback:158.9] Out: 1845 [Urine:1825; Drains:20]  Physical Exam:  General:alert, cooperative, and appears stated age GI: soft, non tender, normal bowel sounds, no palpable masses, no organomegaly, no inguinal hernia Female genitalia: not done Extremities: extremities normal, atraumatic, no cyanosis or edema  Lab Results: Recent Labs    08/12/23 1547 08/12/23 1809 08/13/23 0543  HGB 9.9* 10.6* 9.5*  HCT 29.0* 33.6* 29.1*   BMET Recent Labs    08/12/23 1547 08/13/23 0543  NA 143 136  K 3.4* 3.9  CL 109 103  CO2  --  24  GLUCOSE 131* 123*  BUN 8 11  CREATININE 0.70 0.93  CALCIUM  --  8.4*   No results for input(s): "LABPT", "INR" in the last 72 hours. No results for input(s): "LABURIN" in the last 72 hours. Results for orders placed or performed during the hospital encounter of 12/16/16  Surgical pcr screen     Status: None   Collection Time: 12/16/16  8:46 AM   Specimen: Nasal Mucosa; Nasal Swab  Result Value Ref Range Status   MRSA, PCR NEGATIVE NEGATIVE Final   Staphylococcus aureus NEGATIVE NEGATIVE Final    Comment:        The Xpert SA Assay (FDA approved for NASAL specimens in patients over 62 years of age), is one component of a comprehensive surveillance program.  Test performance has been validated by [facility] for patients  greater than or equal to 47 year old. It is not intended to diagnose infection nor to guide or monitor treatment.     Studies/Results: No results found.  Assessment/Plan: POD#1 partial nephrectomy 1. D/c foley and JP Advance diet  Ambulate in halls with assistance.   LOS: 0 days   Wilkie Aye 08/13/2023, 6:07 PM

## 2023-08-14 DIAGNOSIS — N281 Cyst of kidney, acquired: Secondary | ICD-10-CM | POA: Diagnosis present

## 2023-08-14 DIAGNOSIS — G8929 Other chronic pain: Secondary | ICD-10-CM | POA: Diagnosis present

## 2023-08-14 DIAGNOSIS — Z801 Family history of malignant neoplasm of trachea, bronchus and lung: Secondary | ICD-10-CM | POA: Diagnosis not present

## 2023-08-14 DIAGNOSIS — Z800 Family history of malignant neoplasm of digestive organs: Secondary | ICD-10-CM | POA: Diagnosis not present

## 2023-08-14 DIAGNOSIS — E739 Lactose intolerance, unspecified: Secondary | ICD-10-CM | POA: Diagnosis present

## 2023-08-14 DIAGNOSIS — Z885 Allergy status to narcotic agent status: Secondary | ICD-10-CM | POA: Diagnosis not present

## 2023-08-14 DIAGNOSIS — Z881 Allergy status to other antibiotic agents status: Secondary | ICD-10-CM | POA: Diagnosis not present

## 2023-08-14 DIAGNOSIS — E785 Hyperlipidemia, unspecified: Secondary | ICD-10-CM | POA: Diagnosis present

## 2023-08-14 DIAGNOSIS — J45909 Unspecified asthma, uncomplicated: Secondary | ICD-10-CM | POA: Diagnosis present

## 2023-08-14 DIAGNOSIS — Z880 Allergy status to penicillin: Secondary | ICD-10-CM | POA: Diagnosis not present

## 2023-08-14 DIAGNOSIS — Z888 Allergy status to other drugs, medicaments and biological substances status: Secondary | ICD-10-CM | POA: Diagnosis not present

## 2023-08-14 DIAGNOSIS — N2889 Other specified disorders of kidney and ureter: Secondary | ICD-10-CM | POA: Diagnosis present

## 2023-08-14 DIAGNOSIS — Z79899 Other long term (current) drug therapy: Secondary | ICD-10-CM | POA: Diagnosis not present

## 2023-08-14 DIAGNOSIS — E7800 Pure hypercholesterolemia, unspecified: Secondary | ICD-10-CM | POA: Diagnosis present

## 2023-08-14 DIAGNOSIS — K219 Gastro-esophageal reflux disease without esophagitis: Secondary | ICD-10-CM | POA: Diagnosis present

## 2023-08-14 DIAGNOSIS — Z96651 Presence of right artificial knee joint: Secondary | ICD-10-CM | POA: Diagnosis present

## 2023-08-14 DIAGNOSIS — C641 Malignant neoplasm of right kidney, except renal pelvis: Secondary | ICD-10-CM | POA: Diagnosis present

## 2023-08-14 DIAGNOSIS — Z87891 Personal history of nicotine dependence: Secondary | ICD-10-CM | POA: Diagnosis not present

## 2023-08-14 DIAGNOSIS — Z833 Family history of diabetes mellitus: Secondary | ICD-10-CM | POA: Diagnosis not present

## 2023-08-14 DIAGNOSIS — K5900 Constipation, unspecified: Secondary | ICD-10-CM | POA: Diagnosis present

## 2023-08-14 DIAGNOSIS — Z8051 Family history of malignant neoplasm of kidney: Secondary | ICD-10-CM | POA: Diagnosis not present

## 2023-08-14 DIAGNOSIS — Z9049 Acquired absence of other specified parts of digestive tract: Secondary | ICD-10-CM | POA: Diagnosis not present

## 2023-08-14 MED ORDER — ACETAMINOPHEN 325 MG PO TABS
650.0000 mg | ORAL_TABLET | Freq: Four times a day (QID) | ORAL | Status: DC | PRN
  Administered 2023-08-14: 650 mg via ORAL
  Filled 2023-08-14: qty 2

## 2023-08-14 NOTE — Progress Notes (Signed)
2 Days Post-Op Subjective: Patient reports moderate abdominal pain. positive flatus. Hemoglobin stable. Tolerating clears. Patient not doing IS  Objective: Vital signs in last 24 hours: Temp:  [98.6 F (37 C)-100.2 F (37.9 C)] 100.2 F (37.9 C) (01/26 1754) Pulse Rate:  [71-105] 105 (01/26 1334) Resp:  [14-20] 20 (01/26 1334) BP: (118-139)/(73-77) 118/77 (01/26 1334) SpO2:  [92 %-93 %] 92 % (01/26 1334)  Intake/Output from previous day: 01/25 0701 - 01/26 0700 In: 1446.5 [I.V.:1287.6; IV Piggyback:158.9] Out: 3545 [Urine:3525; Drains:20] Intake/Output this shift: No intake/output data recorded.  Physical Exam:  General:alert, cooperative, and appears stated age GI: soft, non tender, normal bowel sounds, no palpable masses, no organomegaly, no inguinal hernia Female genitalia: not done Extremities: extremities normal, atraumatic, no cyanosis or edema  Lab Results: Recent Labs    08/12/23 1547 08/12/23 1809 08/13/23 0543  HGB 9.9* 10.6* 9.5*  HCT 29.0* 33.6* 29.1*   BMET Recent Labs    08/12/23 1547 08/13/23 0543  NA 143 136  K 3.4* 3.9  CL 109 103  CO2  --  24  GLUCOSE 131* 123*  BUN 8 11  CREATININE 0.70 0.93  CALCIUM  --  8.4*   No results for input(s): "LABPT", "INR" in the last 72 hours. No results for input(s): "LABURIN" in the last 72 hours. Results for orders placed or performed during the hospital encounter of 12/16/16  Surgical pcr screen     Status: None   Collection Time: 12/16/16  8:46 AM   Specimen: Nasal Mucosa; Nasal Swab  Result Value Ref Range Status   MRSA, PCR NEGATIVE NEGATIVE Final   Staphylococcus aureus NEGATIVE NEGATIVE Final    Comment:        The Xpert SA Assay (FDA approved for NASAL specimens in patients over 74 years of age), is one component of a comprehensive surveillance program.  Test performance has been validated by [facility] for patients greater than or equal to 15 year old. It is not intended to diagnose  infection nor to guide or monitor treatment.     Studies/Results: No results found.  Assessment/Plan: POD#2 partial nephrectomy Regular diet Ambulate in halls with assistance Continue current pain control regiment. IS shown to patient and she is instructed to do it 7-8x per hour   LOS: 0 days   Wilkie Aye 08/14/2023, 7:24 PM

## 2023-08-14 NOTE — Progress Notes (Signed)
Mobility Specialist - Progress Note   08/14/23 0920  Mobility  Activity Ambulated with assistance in hallway  Level of Assistance Contact guard assist, steadying assist  Assistive Device  (HHA)  Distance Ambulated (ft) 100 ft  Range of Motion/Exercises Active  Activity Response Tolerated well  Mobility Referral Yes  Mobility visit 1 Mobility  Mobility Specialist Start Time (ACUTE ONLY) 0910  Mobility Specialist Stop Time (ACUTE ONLY) 0920  Mobility Specialist Time Calculation (min) (ACUTE ONLY) 10 min   Pt was found in bed and agreeable to ambulate. No complaints with session. At EOS returned to recliner chair with all needs met. Call bell in reach.  Billey Chang Mobility Specialist

## 2023-08-14 NOTE — Progress Notes (Signed)
Mobility Specialist - Progress Note   08/14/23 1528  Mobility  Activity Ambulated with assistance in hallway  Level of Assistance Contact guard assist, steadying assist  Assistive Device Other (Comment) (Hallway Rails)  Distance Ambulated (ft) 170 ft  Range of Motion/Exercises Active  Activity Response Tolerated well  Mobility Referral Yes  Mobility visit 1 Mobility  Mobility Specialist Start Time (ACUTE ONLY) 1518  Mobility Specialist Stop Time (ACUTE ONLY) 1528  Mobility Specialist Time Calculation (min) (ACUTE ONLY) 10 min   Pt was found in bed and agreeable to ambulate. No complaints with session. At EOS returned to bed with all needs met. Call bell in reach and daughter in room.  Billey Chang Mobility Specialist

## 2023-08-14 NOTE — Plan of Care (Signed)
  Problem: Education: Goal: Knowledge of General Education information will improve Description: Including pain rating scale, medication(s)/side effects and non-pharmacologic comfort measures Outcome: Progressing   Problem: Health Behavior/Discharge Planning: Goal: Ability to manage health-related needs will improve Outcome: Progressing   Problem: Clinical Measurements: Goal: Ability to maintain clinical measurements within normal limits will improve Outcome: Progressing   Problem: Activity: Goal: Risk for activity intolerance will decrease Outcome: Progressing   Problem: Bowel/Gastric: Goal: Gastrointestinal status for postoperative course will improve Outcome: Progressing

## 2023-08-15 NOTE — TOC Transition Note (Signed)
Transition of Care [facility]) - Discharge Note   Patient Details  Name: [name] MRN: [id number] Date of Birth: 25-Feb-1956  [facility] CM/SW Contact:  Lanier Clam, RN Phone Number: 08/15/2023, 10:10 AM   Clinical Narrative:  d/c home no needs.     Final next level of care: Home/Self Care Barriers to Discharge: No Barriers Identified   Patient Goals and CMS Choice            Discharge Placement                       Discharge Plan and Services Additional resources added to the After Visit Summary for                                       Social Drivers of Health (SDOH) Interventions SDOH Screenings   Food Insecurity: No Food Insecurity (08/12/2023)  Housing: Low Risk  (08/12/2023)  Transportation Needs: No Transportation Needs (08/12/2023)  Utilities: Not At Risk (08/12/2023)  Financial Resource Strain: Low Risk  (04/03/2023)   Received from [facility]  Physical Activity: Sufficiently Active (04/03/2023)   Received from [facility]  Social Connections: Moderately Integrated (08/12/2023)  Stress: No Stress Concern Present (05/03/2023)   Received from [facility]  Tobacco Use: Medium Risk (08/12/2023)     Readmission Risk Interventions     No data to display

## 2023-08-15 NOTE — Progress Notes (Signed)
AVS and discharge instructions reviewed w/ patient. Patient verbalized understanding.

## 2023-08-15 NOTE — Plan of Care (Signed)

## 2023-08-15 NOTE — Plan of Care (Signed)

## 2023-08-15 NOTE — Discharge Summary (Signed)
Date of admission: 08/12/2023  Date of discharge: 08/15/2023  Admission diagnosis: Right renal mass  Discharge diagnosis: same  Secondary diagnoses: chronic pain, nephrolithiasis, asthma, HLD, GERD  History and Physical: For full details, please see admission history and physical. Briefly, [name] is a 68 y.o. year old patient with  a solid and enhancing renal mass measuring 3.8 cm involving the RIGHT kidney. The mass was discovered via CT this fall during an evaluation for right flank pain and further characterized via MRI at [facility].    -Anatomy: Exophytic 3.8 cm right mid-pole mass w/ single artery and vein. Normal left kidney with no stone burden  -Personal/family history of GU malignancies: denies  -Smoking history: smoked causally during high school and college--quit 44 years ago  -Prior abdominal surgeries: Lap cholecystectomy in 2014  -Labs: (03/2023): Cr-0.8, eGFR> 60  -History of kidney stones: s/p right ureteroscopy in 04/2023 with Dr. Hurshel Party not tolerate stent well--s/p right stent removal on 05/23/23  -She is voiding w/o difficulty and denies residual flank pain, dysuria or hematuria.   Hospital Course: Pt was admitted and taken to the OR on 08/12/23 for a RAL right partial nephrectomy.  Pt tolerated the procedure well and was hemodynamically stable throughout. She was extubated without complication and woke up from anesthesia neurologically intact.  She was then transferred from the OR to PACU and then to the floor without difficulty.  Post op course progressed as expected.  She remained hemodynamically stable. Foley and JP drain were d/c'd on POD 1.  Pt was able to ambulate and began passing flatus on POD 1 as well. She has not had a BM post op.  Pain was somewhat of an issue but this was more controlled by POD 3.  Diet was advanced as tolerated.  By POD 3 pt had met all d/c criteria and was felt stable for d/c home.   Laboratory values:  Recent Labs    08/12/23 1547  08/12/23 1809 08/13/23 0543  HGB 9.9* 10.6* 9.5*  HCT 29.0* 33.6* 29.1*   Recent Labs    08/12/23 1547 08/13/23 0543  CREATININE 0.70 0.93    Disposition: Home  Discharge instruction: The patient was instructed to be ambulatory but told to refrain from heavy lifting, strenuous activity, or driving.   Discharge medications:  Allergies as of 08/15/2023       Reactions   Morphine Anaphylaxis   Low b/p.[name]...hallucinations Pt was hospitalized due to reaction.   Penicillins Anaphylaxis      Chlorhexidine Hives, Itching   Lactose Intolerance (gi)    Stomach aches    Nsaids    Felt weird taking    Erythromycin Hives, Rash   Latex Itching, Rash   Blistering        Medication List     STOP taking these medications    MAGNESIUM PO   PREBIOTIC PRODUCT PO   QC TUMERIC COMPLEX PO       TAKE these medications    atorvastatin 40 MG tablet Commonly known as: LIPITOR Take 20 mg by mouth daily.   cyclobenzaprine 10 MG tablet Commonly known as: FLEXERIL Take 10 mg by mouth 3 (three) times daily as needed for muscle spasms.   docusate sodium 100 MG capsule Commonly known as: COLACE Take 1 capsule (100 mg total) by mouth 2 (two) times daily.   oxyCODONE-acetaminophen 10-325 MG tablet Commonly known as: PERCOCET Take 1 tablet by mouth every 8 (eight) hours as needed for pain. What changed: when to  take this   pantoprazole 40 MG tablet Commonly known as: PROTONIX Take 40 mg by mouth daily.   Ventolin HFA 108 (90 Base) MCG/ACT inhaler Generic drug: albuterol Inhale 1-2 puffs into the lungs every 6 (six) hours as needed for wheezing or shortness of breath.        Followup:   Follow-up Information     Rene Paci, MD Follow up on 08/29/2023.   Specialty: Urology Why: at 1:45 Contact information: [address] [place] [address] [place] [address] [phone]

## 2023-08-17 LAB — SURGICAL PATHOLOGY

## 2023-09-02 ENCOUNTER — Other Ambulatory Visit (HOSPITAL_COMMUNITY): Payer: Self-pay | Admitting: Urology

## 2023-09-02 DIAGNOSIS — C641 Malignant neoplasm of right kidney, except renal pelvis: Secondary | ICD-10-CM

## 2023-09-02 DIAGNOSIS — R310 Gross hematuria: Secondary | ICD-10-CM
